# Patient Record
Sex: Female | Born: 1965 | Race: White | Hispanic: No | Smoking: Never smoker
Health system: Southern US, Community
[De-identification: ages and names within clinical notes are randomized; demographics above are authoritative.]

## PROBLEM LIST (undated history)

## (undated) DIAGNOSIS — Z9889 Other specified postprocedural states: Secondary | ICD-10-CM

## (undated) DIAGNOSIS — E119 Type 2 diabetes mellitus without complications: Secondary | ICD-10-CM

## (undated) DIAGNOSIS — E039 Hypothyroidism, unspecified: Secondary | ICD-10-CM

## (undated) DIAGNOSIS — R112 Nausea with vomiting, unspecified: Secondary | ICD-10-CM

## (undated) DIAGNOSIS — E7800 Pure hypercholesterolemia, unspecified: Secondary | ICD-10-CM

## (undated) DIAGNOSIS — F419 Anxiety disorder, unspecified: Secondary | ICD-10-CM

## (undated) DIAGNOSIS — M1990 Unspecified osteoarthritis, unspecified site: Secondary | ICD-10-CM

## (undated) DIAGNOSIS — E079 Disorder of thyroid, unspecified: Secondary | ICD-10-CM

## (undated) DIAGNOSIS — J45909 Unspecified asthma, uncomplicated: Secondary | ICD-10-CM

## (undated) DIAGNOSIS — I10 Essential (primary) hypertension: Secondary | ICD-10-CM

## (undated) HISTORY — PX: CARPAL TUNNEL RELEASE: SHX101

## (undated) HISTORY — PX: ROTATOR CUFF REPAIR: SHX139

## (undated) HISTORY — PX: GALLBLADDER SURGERY: SHX652

## (undated) HISTORY — PX: THUMB ARTHROSCOPY: SHX2509

## (undated) HISTORY — DX: Disorder of thyroid, unspecified: E07.9

## (undated) HISTORY — DX: Essential (primary) hypertension: I10

## (undated) HISTORY — PX: NECK SURGERY: SHX720

## (undated) HISTORY — PX: CHOLECYSTECTOMY: SHX55

## (undated) HISTORY — PX: KNEE ARTHROSCOPY: SUR90

## (undated) HISTORY — PX: WISDOM TOOTH EXTRACTION: SHX21

## (undated) HISTORY — PX: APPENDECTOMY: SHX54

## (undated) HISTORY — PX: SPINAL FUSION: SHX223

---

## 2003-02-24 ENCOUNTER — Encounter: Payer: Self-pay | Admitting: Family Medicine

## 2003-02-24 ENCOUNTER — Ambulatory Visit (HOSPITAL_COMMUNITY): Admission: RE | Admit: 2003-02-24 | Discharge: 2003-02-24 | Payer: Self-pay | Admitting: Family Medicine

## 2003-03-03 ENCOUNTER — Encounter: Payer: Self-pay | Admitting: Family Medicine

## 2003-03-03 ENCOUNTER — Encounter (HOSPITAL_COMMUNITY): Admission: RE | Admit: 2003-03-03 | Discharge: 2003-04-02 | Payer: Self-pay | Admitting: Family Medicine

## 2003-03-17 ENCOUNTER — Ambulatory Visit (HOSPITAL_COMMUNITY): Admission: RE | Admit: 2003-03-17 | Discharge: 2003-03-17 | Payer: Self-pay | Admitting: General Surgery

## 2004-06-13 ENCOUNTER — Ambulatory Visit (HOSPITAL_COMMUNITY): Admission: RE | Admit: 2004-06-13 | Discharge: 2004-06-13 | Payer: Self-pay | Admitting: Family Medicine

## 2004-06-17 ENCOUNTER — Ambulatory Visit (HOSPITAL_COMMUNITY): Admission: RE | Admit: 2004-06-17 | Discharge: 2004-06-17 | Payer: Self-pay | Admitting: Family Medicine

## 2004-12-29 HISTORY — PX: GALLBLADDER SURGERY: SHX652

## 2005-06-19 ENCOUNTER — Ambulatory Visit: Payer: Self-pay | Admitting: Orthopedic Surgery

## 2005-07-30 ENCOUNTER — Ambulatory Visit: Payer: Self-pay | Admitting: Internal Medicine

## 2005-08-05 ENCOUNTER — Ambulatory Visit (HOSPITAL_COMMUNITY): Admission: RE | Admit: 2005-08-05 | Discharge: 2005-08-05 | Payer: Self-pay | Admitting: Internal Medicine

## 2005-08-05 ENCOUNTER — Ambulatory Visit: Payer: Self-pay | Admitting: Internal Medicine

## 2005-08-05 ENCOUNTER — Encounter: Payer: Self-pay | Admitting: Internal Medicine

## 2006-12-29 HISTORY — PX: SPINAL FUSION: SHX223

## 2007-01-22 ENCOUNTER — Inpatient Hospital Stay (HOSPITAL_COMMUNITY): Admission: RE | Admit: 2007-01-22 | Discharge: 2007-01-25 | Payer: Self-pay | Admitting: Neurosurgery

## 2010-10-21 ENCOUNTER — Encounter: Admission: RE | Admit: 2010-10-21 | Discharge: 2010-10-21 | Payer: Self-pay | Admitting: Family Medicine

## 2011-05-16 NOTE — Op Note (Signed)
NAMELIESEL, PECKENPAUGH            ACCOUNT NO.:  [id number]   MEDICAL RECORD NO.:  [id number]          PATIENT TYPE:  INP   LOCATION:  2899                         FACILITY:  [facility]   PHYSICIAN:  Velora Heckler, MD      DATE OF BIRTH:  08-03-1966   DATE OF PROCEDURE:  01/22/2007  DATE OF DISCHARGE:                               OPERATIVE REPORT   PREOPERATIVE DIAGNOSIS:  Degenerative disk disease.   POSTOPERATIVE DIAGNOSIS:  Degenerative disk disease.   PROCEDURE:  Anterior preperitoneal exposure of lumbosacral spine.   SURGEON:  Velora Heckler, MD, FACS   ASSISTANT:  Harriette Bouillon MD, FACS   ANESTHESIA:  General.   ESTIMATED BLOOD LOSS:  Minimal.   PREPARATION:  Betadine.   COMPLICATIONS:  None.   INDICATIONS:  The patient is a 45 year old white female patient of Dr.  Coletta Memos from [place], [place].  She has degenerative disk  disease.  Dr. Franky Macho plans anterior fusion at the L5-S1 level.  He  desires an anterior preperitoneal exposure.   BODY OF REPORT:  Procedure is done in OR #4 at the neurosurgical  operating theater [place]. [facility].  The patient is  brought to the operating room, placed in supine position on the  operating room table.  Following administration of general anesthesia,  the patient is prepped and draped in usual strict aseptic fashion.  After ascertaining that an adequate level of anesthesia had been  obtained a left paramedian incision is made from just above the level of  the umbilicus to the pubis.  Dissection is carried through subcutaneous  tissues.  Hemostasis obtained with electrocautery.  The anterior rectus  fascia was incised with electrocautery.  The rectus muscle was mobilized  posteriorly and mobilized to the left.  Inferior epigastric vessels were  divided between hemostats and ligated with 2-0 silk ties.  Preperitoneal  space is entered laterally.  Peritoneal sac is then mobilized with  gentle blunt  dissection.  Round ligament of the uterus is divided  between hemostats and ligated with 2-0 silk ties.  Dissection was  continued down to the level of psoas musculature.  Peritoneal sac is  mobilized to the patient's right and cephalad.  A Synthes retractor set  is placed and used to maintain exposure.  Using gentle blunt dissection  the retroperitoneum is delineated.  The left ureter was mobilized across  the midline.  The L5-S1 disk space was identified.  Using the harmonic  scalpel.  The retroperitoneum is dissected.  The annulus of the disk is  exposed.  The confluence of the iliac veins and the bifurcation of aorta  are well cephalad of the disk space.  Synthes retractors placed to  maintain exposure.  The disk space completely cleared laterally and the  L5 vertebral body and S1 body are also exposed.  Good hemostasis was  obtained with the harmonic scalpel and the electrocautery.  At this  point Dr. Coletta Memos came to the operating room.  He will dictate the  instrumentation of the spine under a separate operative report.   After instrumentation of the  spine is complete and radiographs were  taken, general surgery returned to the room to inspect the wound and  close the abdomen.  Good hemostasis was noted throughout.  Retractor  system was removed.  All packs were removed.  Good hemostasis was noted.  Peritoneal sac is returned to its normal anatomic location.  Rectus  muscle appears viable.  Anterior rectus sheath is closed with  interrupted #1 Vicryl sutures.  Subcutaneous tissues irrigated.  Skin  was closed with stainless steel staples.  Sterile dressings were  applied.  The patient is awakened from anesthesia and brought to the  recovery room in stable condition.  The patient tolerated the procedure  well.      Velora Heckler, MD  Electronically Signed     TMG/MEDQ  D:  01/22/2007  T:  01/22/2007  Job:  [id number]   cc:   Coletta Memos, M.D.

## 2011-05-16 NOTE — Op Note (Signed)
[name], [name]                      ACCOUNT NO.:  [id number]   MEDICAL RECORD NO.:  [id number]                   PATIENT TYPE:  AMB   LOCATION:  DAY                                  FACILITY:  [facility]   PHYSICIAN:  Dalia Heading, M.D.               DATE OF BIRTH:  1966-06-23   DATE OF PROCEDURE:  03/17/2003  DATE OF DISCHARGE:                                 OPERATIVE REPORT   PREOPERATIVE DIAGNOSIS:  Chronic cholecystitis.   POSTOPERATIVE DIAGNOSIS:  Chronic cholecystitis.   PROCEDURE:  Laparoscopic cholecystectomy.   SURGEON:  Dalia Heading, M.D.   ASSISTANT:  Bernerd Limbo. Leona Carry, M.D.   ANESTHESIA:  General endotracheal.   INDICATIONS FOR PROCEDURE:  The patient is a 45 year old white female who  was referred for evaluation and treatment of biliary colic secondary to  chronic cholecystitis.  The risks and benefits of the procedure, including  bleeding, infection, hepatobiliary injury, and the possibility of an open  procedure were fully explained to the patient who gave informed consent.   PROCEDURE:  The patient was placed in the supine position.  After induction  of general endotracheal anesthesia, the abdomen was prepped and draped using  the usual sterile technique with Betadine.  Surgical site confirmation was  performed.   A supraumbilical incision was made down to the fascia.  The Veress needle  was introduced into the abdominal cavity, and confirmation of placement was  done using the saline drop test.  The abdomen was then insufflated to 16  mmHg pressure.  An 11-mm trocar was introduced into the abdominal cavity  under direct visualization without difficulty.  The patient was placed in  reverse Trendelenburg position, and an additional 11-mm trocar was placed in  the epigastric region and 5-mm trocars were placed in the right upper  quadrant and right flank regions.  The liver was inspected and noted to be  within normal limits.  The gallbladder was  retracted superiorly and  laterally.  The dissection was begun around the infundibulum of the  gallbladder.  The cystic duct was first identified.  Its junction to the  infundibulum was fully identified.  Endoclips were placed proximally and  distally on the cystic duct, and the cystic duct was divided.  This was  likewise done on the cystic artery.  The gallbladder was then freed away  from the gallbladder fossa using Bovie electrocautery.  The gallbladder was  delivered through the epigastric trocar site without difficulty.  The  gallbladder fossa was inspected, and no abnormal bleeding or bile leakage  was noted.  Surgicel was placed in the gallbladder fossa.  The subhepatic  space as well as right hepatic gutter were evacuated of all fluid and air  prior to removal of the trocars.   All wounds were irrigated with normal saline.  All wounds were injected with  0.5% Sensorcaine.  The supraumbilical fascia was reapproximated using an 0  Vicryl interrupted suture.  All skin incisions were closed using staples.  Betadine ointment and dry sterile dressings were applied.   All tape and needle counts were correct at the end of the procedure.  The  patient was awakened, extubated, and transferred to the PACU in stable  condition.   COMPLICATIONS:  None.   SPECIMENS:  Gallbladder.   ESTIMATED BLOOD LOSS:  Minimal.                                               Dalia Heading, M.D.    MAJ/MEDQ  D:  03/17/2003  T:  03/17/2003  Job:  [id number]   cc:   Kirk Ruths, M.D.  [address]  [place]  [place] [address]  Fax: [phone]

## 2011-05-16 NOTE — Op Note (Signed)
[name], [name]            ACCOUNT NO.:  [id number]   MEDICAL RECORD NO.:  [id number]          PATIENT TYPE:  INP   LOCATION:  2899                         FACILITY:  [facility]   PHYSICIAN:  Coletta Memos, M.D.     DATE OF BIRTH:  1966-12-27   DATE OF PROCEDURE:  01/22/2007  DATE OF DISCHARGE:                               OPERATIVE REPORT   PREOPERATIVE DIAGNOSES:  1. Degenerative disk disease, L5-S1.  2. Low back pain.  3. Spondylosis without myelopathy, lumbar spine.   POSTOPERATIVE DIAGNOSES:  1. Degenerative disk disease, L5-S1.  2. Low back pain.  3. Spondylosis without myelopathy, lumbar spine.   PROCEDURE:  Anterior lumbar interbody fusion using Synthes PEEK  interbody cage filled with morselized allograft graft.   COMPLICATIONS:  None.   SURGEON:  [place].   ASSISTANT:  Phoebe Perch.   Opening and approach to be dictated under separate cover by Dr. Darnell Level.   INDICATIONS:  [name] is a woman who presents with significant low  back pain.  MRI showed a degenerated disk without disk herniations or  nerve root compression.   OPERATIVE NOTE:  [name] was brought to the operating room intubated  and placed under general anesthetic without difficulty.  She was  positioned supine on the table, and all pressure points were properly  padded.  A Foley catheter was then placed under sterile conditions.  Dr.  Gerrit Friends then prepped and opened the abdomen.  Again, that will be  dictated under separate cover.  After opening and exposing the L5-S1  disk space, I then took x-rays to confirm my location.  Once that was  done, I then proceeded to perform a diskectomy at L5-S1 for  decompression of the spinal canal.  I prepared the endplates by  scraping down the bone and roughing the surfaces with curettes.  With  Dr. Doreen Beam assistance, we removed the disk material until I felt that  I had adequate exposure and good interface available.  I then sized the  disk space  with a trial.  I then placed a 8-mm angle small foot print  Synthes Syn Fix device.  That was placed using a ___________ retractor  without difficulty.  I then placed four  screws first by using an awl and then using self-drilling and tapping  screws.  This was done with Dr. Doreen Beam assistance.  Final x-ray showed  that the SynFix and screws were in good position.  I placed some Vitoss  around the bone.  Infuse was placed within the device.  The patient  tolerated the procedure. After that, the patient had a close.           ______________________________  Coletta Memos, M.D.     KC/MEDQ  D:  01/22/2007  T:  01/22/2007  Job:  [id number]

## 2011-05-16 NOTE — H&P (Signed)
   [name], [name]                      ACCOUNT NO.:  [id number]   MEDICAL RECORD NO.:  [id number]                   PATIENT TYPE:   LOCATION:                                       FACILITY:   PHYSICIAN:  Dalia Heading, M.D.               DATE OF BIRTH:  Jan 26, 1966   DATE OF ADMISSION:  03/17/2003  DATE OF DISCHARGE:                                HISTORY & PHYSICAL   CHIEF COMPLAINT:  Chronic cholecystectomy.   HISTORY OF PRESENT ILLNESS:  The patient is a 45 year old white female who  was referred for evaluation and treatment of biliary colic secondary to  chronic cholecystitis.  She has been having right upper quadrant abdominal  pain with radiation to the right flank and back, nausea, indigestion,  bloating, and fatty food intolerance over the past few months.  No fever,  chills, or jaundice had been noted.   PAST MEDICAL HISTORY:  Extrinsic allergies.   PAST SURGICAL HISTORY:  Appendectomy, knee surgery, ACL repair.   CURRENT MEDICATIONS:  Allegra p.r.n., Bextra p.r.n.   ALLERGIES:  No known drug allergies.   REVIEW OF SYSTEMS:  The patient denies smoking.  She denies any other  cardiopulmonary difficulties or bleeding disorders.   PHYSICAL EXAMINATION:  GENERAL:  The patient is a well-developed, well-  nourished white female in no acute distress.  VITAL SIGNS:  She is afebrile and vital signs are stable.  HEENT:  Reveals no scleral icterus.  LUNGS:  Clear to auscultation with equal breath sounds bilaterally.  HEART:  Reveals a regular rate and rhythm without S3, S4, or murmurs.  ABDOMEN:  Soft and nondistended.  She is tender in the right upper quadrant  to palpation.  No hepatosplenomegaly, masses, or hernias are identified.   LABORATORY DATA:  Ultrasound of gallbladder was reportedly normal.  Hepatobiliary scan reveals a 30% gallbladder ejection fraction with  reproducible symptoms with CCK injection.   IMPRESSION:  Chronic cholecystitis.    PLAN:   The patient is scheduled for a laparoscopic cholecystectomy on March 17, 2003.  The risks and benefits of the procedure including bleeding,  infection, hepatobiliary injury, the possibility of an open procedure were  fully explained to the patient, who gave informed consent.                                               Dalia Heading, M.D.    MAJ/MEDQ  D:  03/07/2003  T:  03/07/2003  Job:  [id number]   cc:   Kirk Ruths, M.D.  [address]  [place]  [place] [address]  Fax: [phone]

## 2011-05-16 NOTE — Consult Note (Signed)
[name], [name]            ACCOUNT NO.:  [id number]   MEDICAL RECORD NO.:  [id number]         PATIENT TYPE:  AMB   LOCATION:                                FACILITY:  [facility]   PHYSICIAN:  R. Roetta Sessions, M.D. DATE OF BIRTH:  1966/01/01   DATE OF CONSULTATION:  07/30/2005  DATE OF DISCHARGE:                                   CONSULTATION   GASTROENTEROLOGY CONSULTATION:   PHYSICIAN REQUESTING CONSULTATION:  Bill McGough   CHIEF COMPLAINT:  Diarrhea.   HISTORY OF PRESENT ILLNESS:  [name] is a 45 year old Caucasian female patient  of Dr. Regino Schultze who presents today for further evaluation of chronic  diarrhea.  Two months ago she developed acute onset of diarrhea.  She was  having multiple loose to watery stools daily, especially postprandially.  She saw Dr. Regino Schultze and was started on Flagyl and Lomotil.  She had negative  stool culture, Clostridium difficile, O&P.  She states that the Flagyl did  not seem to help her diarrhea.  As long as she stays on the Lomotil her  stools seem to slow down.  She denies any nocturnal diarrhea.  She has not  had any solid stools in a couple of months.  Prior to this her baseline  bowel movement pattern was generally one daily.  She complains of some  sharp, stabbing, fleeting pains in her abdomen, especially in the morning.  This is often relieved after she has a bowel movement.  Denies any melena or  rectal bleeding.  In the beginning, she did have some nausea but no  vomiting.  She complains of fatigue and feeling run down since this started.  Denies any weight loss or heartburn.  She had her gallbladder removed about  1-1/2 years ago.  Denies any diarrhea associated with this.  She has not  been traveling.  No antibiotic use prior to the development of these  symptoms.  Denies any ill contacts.   CURRENT MEDICATIONS:  1. Allegra-D one b.i.d.  2. Lomotil p.r.n.  3. Tylenol ES p.r.n.  4. Multivitamin with iron once daily.   ALLERGIES:   No known drug allergies.   PAST MEDICAL HISTORY:  Negative for chronic illnesses.   PAST SURGICAL HISTORY:  She has had an appendectomy and cholecystectomy.  She had two knee surgeries, the last one for ACL repair.   FAMILY HISTORY:  Negative for colorectal cancer.  She believes she has a  cousin with Crohn's disease.  Her father has history of brain tumor.   SOCIAL HISTORY:  She is single.  She works for [employer].  She has  never been a smoker, occasionally consumes alcohol.   REVIEW OF SYSTEMS:  GI:  See HPI.  CONSTITUTIONAL:  Denies weight loss.  CARDIOPULMONARY:  Denies chest pain or shortness of breath.   PHYSICAL EXAMINATION:  VITAL SIGNS:  Weight 148, height 5 feet 6 inches,  temperature 98.1, blood pressure 136/76, pulse 64.  GENERAL:  Pleasant well-nourished, well-developed Caucasian female in no  acute distress.  Skin warm and dry; no jaundice.  HEENT:  Pupils equal, round and reactive to light.  Conjunctivae are pink.  Sclerae nonicteric.  Oropharyngeal mucosa moist and pink; no lesions,  erythema, or exudate.  No lymphadenopathy or thyromegaly.  CHEST:  Lungs are clear to auscultation.  Cardiac exam reveals regular rate  and rhythm, normal S1, S2; no murmurs, rubs, or gallops.  ABDOMEN:  Positive bowel sounds, soft, nondistended.  She has mild to  moderate tenderness in the epigastrium and in the right lower quadrant to  deep palpation; no organomegaly or masses, no rebound tenderness, no  guarding, no abdominal bruits or hernias.  EXTREMITIES:  No edema.   IMPRESSION:  [name] is a 45 year old lady with 62-month history of nonbloody  diarrhea associated with epigastric and right lower quadrant abdominal pain.  Stool studies have been negative.  She did not respond to a trial of Flagyl.  Differential diagnosis is quite broad but high on the list would be  inflammatory bowel disease.   PLAN:  1. Colonoscopy with terminal ileoscopy plus or minus biopsies and/or  stool      collection in the near future.  2. Her colon prep would consist of OsmoPrep.  3. CBC, TSH, MET-7, LFTs.   I would like to thank Dr. Regino Schultze for allowing [place] to take part in the care  of this patient.       LL/MEDQ  D:  07/30/2005  T:  07/30/2005  Job:  [id number]   cc:   Kirk Ruths, M.D.  [address]  [place]  [place] [address]  Fax: [phone]

## 2011-05-16 NOTE — Op Note (Signed)
[name], [name]            ACCOUNT NO.:  [id number]   MEDICAL RECORD NO.:  [id number]          PATIENT TYPE:  AMB   LOCATION:  DAY                           FACILITY:  [facility]   PHYSICIAN:  R. Roetta Sessions, M.D. DATE OF BIRTH:  1966/06/22   DATE OF PROCEDURE:  08/05/2005  DATE OF DISCHARGE:                                 OPERATIVE REPORT   PROCEDURE:  Colonoscopy with ileostomy, sentinel biopsy, stool sampling.   INDICATIONS FOR PROCEDURE:  The patient is a 45 year old lady with a two-  month history of nonbloody diarrhea.  Stool studies have come back negative.  Colonoscopy is now being done.  This procedure has been discussed with the  patient at length.  Potential risks, benefits and alternatives have been  reviewed.  Questions answered.  She is agreeable.  Please see documentation  in the medical record.   PROCEDURE NOTE:  O2 saturation, blood pressure, pulse and respirations were  monitored through the entire procedure.  Conscious sedation with Versed 4 mg  IV, Demerol 75 mg IV divided doses.   INSTRUMENT:  Olympus video chip system.   FINDINGS:  A digital rectal exam revealed no abnormalities.   ENDOSCOPIC FINDINGS:  The prep was good.   Rectum:  Examination of the rectal mucosa including retroflexion at the anal  verge revealed no abnormalities.   Colon:  Colonic mucosa was surveyed from the rectal sigmoid junction through  the left, transverse, right colon to the area of the appendiceal orifice,  ileocecal valve and cecum.  These structures were well seen and photographed  for the record.  From this level, scope was slowly withdrawn and all  previously mentioned mucosal surfaces were again seen.  The terminal ileum  was intubated to 15 cm as well.  The terminal ileum mucosa appeared normal.  The colonic mucosa appeared normal.  Segmental biopsies of the transverse,  sigmoid and rectal mucosa were taken for histologic study.  All stool  residue was suctioned out  for microbiology studies.  The patient tolerated  the procedure well and was reactive to endoscopy.   IMPRESSION:  Normal rectum, colon, terminal ileum, segmental biopsies taken,  stool sample taken.   RECOMMENDATIONS:  Follow up on pending studies.  Further recommendations to  follow.       RMR/MEDQ  D:  08/05/2005  T:  08/05/2005  Job:  [id number]   cc:   Kirk Ruths, M.D.  [address]  [place]  [place] [address]  Fax: [phone]

## 2011-05-16 NOTE — Discharge Summary (Signed)
[name], [name]            ACCOUNT NO.:  [id number]   MEDICAL RECORD NO.:  [id number]          PATIENT TYPE:  INP   LOCATION:  3011                         FACILITY:  [facility]   PHYSICIAN:  Coletta Memos, M.D.     DATE OF BIRTH:  Nov 24, 1966   DATE OF ADMISSION:  01/22/2007  DATE OF DISCHARGE:  01/25/2007                               DISCHARGE SUMMARY   ADMITTING DIAGNOSES:  1. Degenerative disk disease L5-S1.  2. Low back pain.  3. Spondylosis without myelopathy.   DISCHARGE DIAGNOSES:  1. Degenerative disk disease L5-S1.  2. Low back pain.  3. Spondylosis without myelopathy.   PROCEDURE:  Anterior lumbar interbody fusion using Synthes PEEK  interbody cage with morselized allograft.   COMPLICATIONS:  None.   This was a 10-6 device, 8 mm angle, small footplate.   [name] was admitted to the hospital secondary to the significant  low back pain and degenerative disk disease at L5-S1.  I recommended an  anterior approach for lumbar fusion.  She agreed.  She was immediately  taken to the operating room where she had an uncomplicated procedure.  The 10-6 was placed without difficulty.  Postoperatively, she has  tolerating a regular diet.  She is voiding without difficulty.  She has  a mild pain in the left hip when she flexes the left leg.  Outside of  that, she is doing well.   I gave her Percocet and Flexeril today for discharge.  I will see her  back in 1 month.  The wound has already been checked by general surgery.           ______________________________  Coletta Memos, M.D.     KC/MEDQ  D:  01/25/2007  T:  01/25/2007  Job:  [id number]

## 2011-09-16 ENCOUNTER — Other Ambulatory Visit: Payer: Self-pay | Admitting: Family Medicine

## 2011-09-16 DIAGNOSIS — Z1231 Encounter for screening mammogram for malignant neoplasm of breast: Secondary | ICD-10-CM

## 2011-10-24 ENCOUNTER — Ambulatory Visit
Admission: RE | Admit: 2011-10-24 | Discharge: 2011-10-24 | Disposition: A | Discharge status: Home / Self Care | Payer: BC Managed Care – PPO | Source: Ambulatory Visit | Attending: Family Medicine | Admitting: Family Medicine

## 2011-10-24 DIAGNOSIS — Z1231 Encounter for screening mammogram for malignant neoplasm of breast: Secondary | ICD-10-CM

## 2013-08-06 ENCOUNTER — Other Ambulatory Visit (HOSPITAL_COMMUNITY): Payer: Self-pay | Admitting: Family Medicine

## 2013-08-06 ENCOUNTER — Ambulatory Visit (HOSPITAL_COMMUNITY)
Admission: RE | Admit: 2013-08-06 | Discharge: 2013-08-06 | Disposition: A | Discharge status: Home / Self Care | Payer: Managed Care, Other (non HMO) | Source: Ambulatory Visit | Attending: Family Medicine | Admitting: Family Medicine

## 2013-08-06 DIAGNOSIS — M25572 Pain in left ankle and joints of left foot: Secondary | ICD-10-CM

## 2013-08-06 DIAGNOSIS — M7730 Calcaneal spur, unspecified foot: Secondary | ICD-10-CM | POA: Insufficient documentation

## 2013-10-04 ENCOUNTER — Other Ambulatory Visit: Payer: Self-pay

## 2013-10-04 DIAGNOSIS — Z1231 Encounter for screening mammogram for malignant neoplasm of breast: Secondary | ICD-10-CM

## 2013-11-11 ENCOUNTER — Ambulatory Visit: Payer: BC Managed Care – PPO

## 2013-12-16 ENCOUNTER — Ambulatory Visit
Admission: RE | Admit: 2013-12-16 | Discharge: 2013-12-16 | Disposition: A | Discharge status: Home / Self Care | Payer: Managed Care, Other (non HMO) | Source: Ambulatory Visit

## 2013-12-16 DIAGNOSIS — Z1231 Encounter for screening mammogram for malignant neoplasm of breast: Secondary | ICD-10-CM

## 2013-12-28 ENCOUNTER — Other Ambulatory Visit: Payer: Self-pay | Admitting: Family Medicine

## 2013-12-28 DIAGNOSIS — R928 Other abnormal and inconclusive findings on diagnostic imaging of breast: Secondary | ICD-10-CM

## 2013-12-29 HISTORY — PX: CARPAL TUNNEL RELEASE: SHX101

## 2014-01-06 ENCOUNTER — Ambulatory Visit
Admission: RE | Admit: 2014-01-06 | Discharge: 2014-01-06 | Disposition: A | Discharge status: Home / Self Care | Payer: Managed Care, Other (non HMO) | Source: Ambulatory Visit | Attending: Family Medicine | Admitting: Family Medicine

## 2014-01-06 DIAGNOSIS — R928 Other abnormal and inconclusive findings on diagnostic imaging of breast: Secondary | ICD-10-CM

## 2015-03-22 ENCOUNTER — Other Ambulatory Visit (HOSPITAL_COMMUNITY): Payer: Self-pay | Admitting: Family Medicine

## 2015-03-22 DIAGNOSIS — M5030 Other cervical disc degeneration, unspecified cervical region: Secondary | ICD-10-CM

## 2015-03-30 ENCOUNTER — Ambulatory Visit (HOSPITAL_COMMUNITY)
Admission: RE | Admit: 2015-03-30 | Discharge: 2015-03-30 | Disposition: A | Discharge status: Home / Self Care | Payer: Managed Care, Other (non HMO) | Source: Ambulatory Visit | Attending: Family Medicine | Admitting: Family Medicine

## 2015-03-30 DIAGNOSIS — M5030 Other cervical disc degeneration, unspecified cervical region: Secondary | ICD-10-CM

## 2015-07-04 ENCOUNTER — Other Ambulatory Visit: Payer: Self-pay

## 2015-07-04 DIAGNOSIS — Z1231 Encounter for screening mammogram for malignant neoplasm of breast: Secondary | ICD-10-CM

## 2015-07-11 ENCOUNTER — Ambulatory Visit
Admission: RE | Admit: 2015-07-11 | Discharge: 2015-07-11 | Disposition: A | Discharge status: Home / Self Care | Payer: Managed Care, Other (non HMO) | Source: Ambulatory Visit

## 2015-07-11 DIAGNOSIS — Z1231 Encounter for screening mammogram for malignant neoplasm of breast: Secondary | ICD-10-CM

## 2016-06-12 ENCOUNTER — Ambulatory Visit (INDEPENDENT_AMBULATORY_CARE_PROVIDER_SITE_OTHER): Payer: Self-pay | Admitting: Otolaryngology

## 2016-06-12 DIAGNOSIS — R42 Dizziness and giddiness: Secondary | ICD-10-CM

## 2016-06-12 DIAGNOSIS — H9313 Tinnitus, bilateral: Secondary | ICD-10-CM

## 2016-06-16 ENCOUNTER — Other Ambulatory Visit (INDEPENDENT_AMBULATORY_CARE_PROVIDER_SITE_OTHER): Payer: Self-pay | Admitting: Otolaryngology

## 2016-06-16 DIAGNOSIS — H9313 Tinnitus, bilateral: Secondary | ICD-10-CM

## 2016-06-16 DIAGNOSIS — R42 Dizziness and giddiness: Secondary | ICD-10-CM

## 2016-07-02 ENCOUNTER — Ambulatory Visit
Admission: RE | Admit: 2016-07-02 | Discharge: 2016-07-02 | Disposition: A | Discharge status: Home / Self Care | Payer: 59 | Source: Ambulatory Visit | Attending: Otolaryngology | Admitting: Otolaryngology

## 2016-07-02 DIAGNOSIS — R42 Dizziness and giddiness: Secondary | ICD-10-CM

## 2016-07-02 DIAGNOSIS — H9313 Tinnitus, bilateral: Secondary | ICD-10-CM

## 2016-12-29 HISTORY — PX: NECK SURGERY: SHX720

## 2017-01-09 DIAGNOSIS — E039 Hypothyroidism, unspecified: Secondary | ICD-10-CM | POA: Diagnosis not present

## 2017-01-09 DIAGNOSIS — J209 Acute bronchitis, unspecified: Secondary | ICD-10-CM | POA: Diagnosis not present

## 2017-01-09 DIAGNOSIS — J069 Acute upper respiratory infection, unspecified: Secondary | ICD-10-CM | POA: Diagnosis not present

## 2017-01-09 DIAGNOSIS — J343 Hypertrophy of nasal turbinates: Secondary | ICD-10-CM | POA: Diagnosis not present

## 2017-03-25 DIAGNOSIS — E039 Hypothyroidism, unspecified: Secondary | ICD-10-CM | POA: Diagnosis not present

## 2017-03-25 DIAGNOSIS — E782 Mixed hyperlipidemia: Secondary | ICD-10-CM | POA: Diagnosis not present

## 2017-03-25 DIAGNOSIS — R7309 Other abnormal glucose: Secondary | ICD-10-CM | POA: Diagnosis not present

## 2017-03-25 DIAGNOSIS — Z1389 Encounter for screening for other disorder: Secondary | ICD-10-CM | POA: Diagnosis not present

## 2017-03-25 DIAGNOSIS — Z0001 Encounter for general adult medical examination with abnormal findings: Secondary | ICD-10-CM | POA: Diagnosis not present

## 2017-03-25 DIAGNOSIS — I10 Essential (primary) hypertension: Secondary | ICD-10-CM | POA: Diagnosis not present

## 2017-07-03 DIAGNOSIS — M5136 Other intervertebral disc degeneration, lumbar region: Secondary | ICD-10-CM | POA: Diagnosis not present

## 2017-07-03 DIAGNOSIS — M5431 Sciatica, right side: Secondary | ICD-10-CM | POA: Diagnosis not present

## 2017-07-03 DIAGNOSIS — M545 Low back pain: Secondary | ICD-10-CM | POA: Diagnosis not present

## 2017-07-10 DIAGNOSIS — M4807 Spinal stenosis, lumbosacral region: Secondary | ICD-10-CM | POA: Diagnosis not present

## 2017-07-10 DIAGNOSIS — M5431 Sciatica, right side: Secondary | ICD-10-CM | POA: Diagnosis not present

## 2017-07-10 DIAGNOSIS — M48061 Spinal stenosis, lumbar region without neurogenic claudication: Secondary | ICD-10-CM | POA: Diagnosis not present

## 2017-07-10 DIAGNOSIS — M5136 Other intervertebral disc degeneration, lumbar region: Secondary | ICD-10-CM | POA: Diagnosis not present

## 2017-08-25 DIAGNOSIS — M25551 Pain in right hip: Secondary | ICD-10-CM | POA: Diagnosis not present

## 2017-08-25 DIAGNOSIS — I10 Essential (primary) hypertension: Secondary | ICD-10-CM | POA: Diagnosis not present

## 2017-09-09 ENCOUNTER — Ambulatory Visit (HOSPITAL_COMMUNITY)
Admission: RE | Admit: 2017-09-09 | Discharge: 2017-09-09 | Disposition: A | Discharge status: Home / Self Care | Payer: 59 | Source: Ambulatory Visit | Attending: Family Medicine | Admitting: Family Medicine

## 2017-09-09 ENCOUNTER — Other Ambulatory Visit (HOSPITAL_COMMUNITY): Payer: Self-pay | Admitting: Family Medicine

## 2017-09-09 DIAGNOSIS — K5730 Diverticulosis of large intestine without perforation or abscess without bleeding: Secondary | ICD-10-CM | POA: Insufficient documentation

## 2017-09-09 DIAGNOSIS — R1031 Right lower quadrant pain: Secondary | ICD-10-CM | POA: Diagnosis not present

## 2017-09-09 DIAGNOSIS — K760 Fatty (change of) liver, not elsewhere classified: Secondary | ICD-10-CM | POA: Insufficient documentation

## 2017-09-09 MED ORDER — IOPAMIDOL (ISOVUE-300) INJECTION 61%
100.0000 mL | Freq: Once | INTRAVENOUS | Status: AC | PRN
  Administered 2017-09-09: 100 mL via INTRAVENOUS

## 2017-09-09 MED ORDER — IOPAMIDOL (ISOVUE-300) INJECTION 61%
INTRAVENOUS | Status: AC
  Filled 2017-09-09: qty 30

## 2017-09-29 ENCOUNTER — Ambulatory Visit (INDEPENDENT_AMBULATORY_CARE_PROVIDER_SITE_OTHER): Payer: 59 | Admitting: Obstetrics & Gynecology

## 2017-09-29 ENCOUNTER — Encounter: Payer: Self-pay | Admitting: Obstetrics & Gynecology

## 2017-09-29 VITALS — BP 124/68 | Ht 66.0 in | Wt 185.0 lb

## 2017-09-29 DIAGNOSIS — M5441 Lumbago with sciatica, right side: Secondary | ICD-10-CM

## 2017-09-29 DIAGNOSIS — R9389 Abnormal findings on diagnostic imaging of other specified body structures: Secondary | ICD-10-CM | POA: Diagnosis not present

## 2017-09-29 MED ORDER — MEDROXYPROGESTERONE ACETATE 10 MG PO TABS: 10.0000 mg | ORAL_TABLET | Freq: Every day | ORAL | 0 refills | Status: DC

## 2017-09-29 NOTE — Progress Notes (Signed)
Chief Complaint  Patient presents with  . Hip Pain  . Back Pain    Blood pressure 124/68, height  (1.676 m), weight 185 lb (83.9 kg).  51 y.o. G0P0000 No LMP recorded (lmp unknown). Patient is postmenopausal. The current method of family planning is post menopausal status.  Outpatient Encounter Prescriptions as of 09/29/2017  Medication Sig  . atenolol (TENORMIN) 25 MG tablet Take 25 mg by mouth daily.  Marland Kitchen BLACK COHOSH EXTRACT PO Take by mouth.  . fexofenadine-pseudoephedrine (ALLEGRA-D 24) 180-240 MG 24 hr tablet Take 1 tablet by mouth daily.  Marland Kitchen levothyroxine (SYNTHROID, LEVOTHROID) 25 MCG tablet Take 25 mcg by mouth daily before breakfast.  . TURMERIC PO Take by mouth.  . medroxyPROGESTERone (PROVERA) 10 MG tablet Take 1 tablet (10 mg total) by mouth daily.   No facility-administered encounter medications on file as of 09/29/2017.     Subjective [name] is seen as a new patient She is referred here from Dr. Lamar Blinks office [name] has an interesting history She underwent a laminectomy with fixation cages about 10 years ago she says L5-S1 and is also had an anterior fixation of ruptured cervical disc  About safe 3-5 months ago she began having sciatica on the right, pain in her tailbone and pain in her right hip almost above her iliac crest  Dr. Phillips Odor ordered a CT scan which is essentially unremarkable except for a minimally thickened endometrial stripe of 11 mm I reviewed the CT scan myself and she's got small insignificant fibroids less than 1 cm and I really can't make out the endometrial stripe definitively but in any event she denies any bleeding over the past 2 years  I believe she was told that there was concern that she may have endometriosis and that may be the cause of her pain but that is certainly not the case both on the official read N/A my evaluation of the CT  Unfortunately I think she stuck with a back source as her pain source She was  told by her neurosurgeon that the pain she is having is not related to either her previous back surgery or any current pathology  Objective Not performed  Pertinent ROS No vaginal bleeding over 2 years No burning with urination, frequency or urgency No nausea, vomiting or diarrhea Nor fever chills or other constitutional symptoms   Labs or studies I reviewed her CT scan including the images The patient had MRI up and not eaten but that is not available to me and she stated that there is a bulging disc between L4-L5 but is bulging to the left not the right and it is not ruptured CLINICAL DATA:  Right lower quadrant pain for 2-3 months.  EXAM: CT ABDOMEN AND PELVIS WITH CONTRAST  TECHNIQUE: Multidetector CT imaging of the abdomen and pelvis was performed using the standard protocol following bolus administration of intravenous contrast.  CONTRAST:  ISOVUE-300 IOPAMIDOL (ISOVUE-300) INJECTION 61%  COMPARISON:  None.  FINDINGS: Lower chest:  No contributory findings.  Hepatobiliary: Hepatic steatosis. No focal finding.Cholecystectomy with normal common bile duct diameter.  Pancreas: Unremarkable.  Spleen: Unremarkable.  Adrenals/Urinary Tract: Negative adrenals. No hydronephrosis or stone. 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 Unremarkable bladder.  Stomach/Bowel: No obstruction. The appendix is difficult to discretely visualize. No pericecal inflammation. No evidence of bowel inflammation of any kind. Mild colonic diverticulosis.  Vascular/Lymphatic: No acute vascular abnormality. No mass or adenopathy.  Reproductive:Notable endometrial thickness of  11 mm for age. Negative adnexae. Heterogeneous myometrial enhancement versus small intramural fibroids.  Other: No ascites or pneumoperitoneum.  Musculoskeletal: No acute or aggressive finding. 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  IMPRESSION: 1. No acute  finding or specific explanation for pain. 2. Hepatic steatosis. 3. Mild colonic diverticulosis. 4. 11 mm endometrial stripe. Given patient age, please ensure no abnormal uterine bleeding or completed menopause, which would prompt ultrasound.   Electronically Signed   By: Marnee Spring M.D.   On: 09/09/2017 14:35         Impression Diagnoses this Encounter::   ICD-10-CM   1. Thickened endometrium R93.89   2. Acute right-sided low back pain with right-sided sciatica M54.41     Established relevant diagnosis(es):   Plan/Recommendations: Meds ordered this encounter  Medications  . atenolol (TENORMIN) 25 MG tablet    Sig: Take 25 mg by mouth daily.  . fexofenadine-pseudoephedrine (ALLEGRA-D 24) 180-240 MG 24 hr tablet    Sig: Take 1 tablet by mouth daily.  Marland Kitchen levothyroxine (SYNTHROID, LEVOTHROID) 25 MCG tablet    Sig: Take 25 mcg by mouth daily before breakfast.  . BLACK COHOSH EXTRACT PO    Sig: Take by mouth.  . TURMERIC PO    Sig: Take by mouth.  . medroxyPROGESTERone (PROVERA) 10 MG tablet    Sig: Take 1 tablet (10 mg total) by mouth daily.    Dispense:  10 tablet    Refill:  0    Labs or Scans Ordered: No orders of the defined types were placed in this encounter.   Management:: Again [name]'s GYN finding of a minimally thickened endometrial stripe of think is unrelated to her back hip or sciatica pain Plan give her 10 days of Provera 10 mg and will see she has any bleeding she is given a contact me via my chart and let me know if she bleeds or doesn't If she does not bleed I will probably do an ultrasound to evaluate her endometrium And if she does bleed probably do cyclical Provera every 3 months for the next couple years until she stops bleeding for year  I have also referred her to Dr. Eduard Clos have in [place] for evaluation of her back pain Dr Eduard Clos is a physiatrists and chronic pain specialist who does a lot of blocks and injections [name]  seems interested in having her evaluate her and treat her for her apparently nonsurgical back pain  Follow up Return if symptoms worsen or fail to improve.        Face to face time:  20 minutes  Greater than 50% of the visit time was spent in counseling and coordination of care with the patient.  The summary and outline of the counseling and care coordination is summarized in the note above.   All questions were answered.  Past Medical History:  Diagnosis Date  . Hypertension   . Thyroid disease     Past Surgical History:  Procedure Laterality Date  . APPENDECTOMY    . CARPAL TUNNEL RELEASE Right   . GALLBLADDER SURGERY    . KNEE ARTHROSCOPY Right    x3  . NECK SURGERY    . SPINAL FUSION      OB History    Gravida Para Term Preterm AB Living   0 0 0 0 0 0   SAB TAB Ectopic Multiple Live Births   0 0 0 0 0      Allergies  Allergen Reactions  .  Latex Anaphylaxis    Social History   Social History  . Marital status: Married    Spouse name: N/A  . Number of children: N/A  . Years of education: N/A   Social History Main Topics  . Smoking status: Never Smoker  . Smokeless tobacco: Never Used  . Alcohol use No  . Drug use: No  . Sexual activity: Yes    Birth control/ protection: Post-menopausal   Other Topics Concern  . None   Social History Narrative  . None    Family History  Problem Relation Age of Onset  . Myasthenia gravis Father   . Hypertension Mother

## 2018-03-26 DIAGNOSIS — M13841 Other specified arthritis, right hand: Secondary | ICD-10-CM | POA: Diagnosis not present

## 2018-06-11 DIAGNOSIS — J069 Acute upper respiratory infection, unspecified: Secondary | ICD-10-CM | POA: Diagnosis not present

## 2018-06-11 DIAGNOSIS — I10 Essential (primary) hypertension: Secondary | ICD-10-CM | POA: Diagnosis not present

## 2018-06-11 DIAGNOSIS — R7309 Other abnormal glucose: Secondary | ICD-10-CM | POA: Diagnosis not present

## 2018-06-11 DIAGNOSIS — E782 Mixed hyperlipidemia: Secondary | ICD-10-CM | POA: Diagnosis not present

## 2018-06-11 DIAGNOSIS — Z1389 Encounter for screening for other disorder: Secondary | ICD-10-CM | POA: Diagnosis not present

## 2018-06-23 ENCOUNTER — Encounter: Payer: Self-pay | Admitting: Internal Medicine

## 2018-07-27 ENCOUNTER — Ambulatory Visit: Payer: 59

## 2019-03-29 DIAGNOSIS — E6609 Other obesity due to excess calories: Secondary | ICD-10-CM | POA: Diagnosis not present

## 2019-03-29 DIAGNOSIS — J302 Other seasonal allergic rhinitis: Secondary | ICD-10-CM | POA: Diagnosis not present

## 2019-03-29 DIAGNOSIS — R42 Dizziness and giddiness: Secondary | ICD-10-CM | POA: Diagnosis not present

## 2019-11-11 ENCOUNTER — Encounter: Payer: Self-pay | Admitting: *Deleted

## 2019-11-14 ENCOUNTER — Encounter: Payer: Self-pay | Admitting: *Deleted

## 2019-11-30 ENCOUNTER — Other Ambulatory Visit: Payer: Self-pay

## 2019-11-30 DIAGNOSIS — Z20822 Contact with and (suspected) exposure to covid-19: Secondary | ICD-10-CM

## 2019-12-02 ENCOUNTER — Telehealth: Payer: Self-pay

## 2019-12-02 NOTE — Telephone Encounter (Signed)
Pt. Checking on COVID 19 test results. Not available yet. 

## 2019-12-03 LAB — NOVEL CORONAVIRUS, NAA: SARS-CoV-2, NAA: DETECTED — AB

## 2019-12-14 ENCOUNTER — Ambulatory Visit: Payer: Self-pay

## 2020-01-10 ENCOUNTER — Ambulatory Visit: Payer: Self-pay

## 2020-01-30 ENCOUNTER — Other Ambulatory Visit: Payer: Self-pay | Admitting: Neurosurgery

## 2020-01-30 DIAGNOSIS — M4722 Other spondylosis with radiculopathy, cervical region: Secondary | ICD-10-CM

## 2020-01-30 DIAGNOSIS — M545 Low back pain, unspecified: Secondary | ICD-10-CM

## 2020-02-23 ENCOUNTER — Ambulatory Visit (INDEPENDENT_AMBULATORY_CARE_PROVIDER_SITE_OTHER): Payer: Self-pay | Admitting: *Deleted

## 2020-02-23 DIAGNOSIS — Z1211 Encounter for screening for malignant neoplasm of colon: Secondary | ICD-10-CM

## 2020-02-23 MED ORDER — NA SULFATE-K SULFATE-MG SULF 17.5-3.13-1.6 GM/177ML PO SOLN: 1.0000 | Freq: Once | ORAL | 0 refills | Status: AC

## 2020-02-23 NOTE — Progress Notes (Addendum)
Gastroenterology Pre-Procedure Review  Request Date: 02/23/2020 Requesting Physician: Dr. Phillips Odor, Last TCS 08/05/2005 done by Dr. Jena Gauss, no polyps  PATIENT REVIEW QUESTIONS: The patient responded to the following health history questions as indicated:    1. Diabetes Melitis: no 2. Joint replacements in the past 12 months: no 3. Major health problems in the past 3 months: no 4. Has an artificial valve or MVP: no 5. Has a defibrillator: no 6. Has been advised in past to take antibiotics in advance of a procedure like teeth cleaning: no 7. Family history of colon cancer: no  8. Alcohol Use: yes, 1 mixed drink every 3 months 9. Illicit drug Use: no 10. History of sleep apnea: no  11. History of coronary artery or other vascular stents placed within the last 12 months: no 12. History of any prior anesthesia complications: yes, n/v 13. There is no height or weight on file to calculate BMI. ht: 5'6 wt: 182 lbs    MEDICATIONS & ALLERGIES:    Patient reports the following regarding taking any blood thinners:   Plavix? no Aspirin? yes Coumadin? no Brilinta? no Xarelto? no Eliquis? no Pradaxa? no Savaysa? no Effient? no  Patient confirms/reports the following medications:  Current Outpatient Medications  Medication Sig Dispense Refill  . albuterol (VENTOLIN HFA) 108 (90 Base) MCG/ACT inhaler Inhale into the lungs as needed for wheezing or shortness of breath.    Marland Kitchen aspirin EC 81 MG tablet Take 81 mg by mouth daily.    Marland Kitchen atenolol (TENORMIN) 25 MG tablet Take 25 mg by mouth daily.    Marland Kitchen BLACK COHOSH EXTRACT PO Take by mouth daily.     Marland Kitchen escitalopram (LEXAPRO) 10 MG tablet Take 10 mg by mouth as needed.    . fexofenadine-pseudoephedrine (ALLEGRA-D 24) 180-240 MG 24 hr tablet Take 1 tablet by mouth daily.    Marland Kitchen levothyroxine (SYNTHROID, LEVOTHROID) 25 MCG tablet Take 25 mcg by mouth daily before breakfast.    . meclizine (ANTIVERT) 25 MG tablet Take 25 mg by mouth as needed for dizziness.     . medroxyPROGESTERone (PROVERA) 10 MG tablet Take 1 tablet (10 mg total) by mouth daily. 10 tablet 0  . pravastatin (PRAVACHOL) 20 MG tablet Take 20 mg by mouth daily.     No current facility-administered medications for this visit.    Patient confirms/reports the following allergies:  Allergies  Allergen Reactions  . Latex Anaphylaxis    No orders of the defined types were placed in this encounter.   AUTHORIZATION INFORMATION Primary Insurance: [place] ,  [place] #:[id number],  Group #: [id number] Pre-Cert / Berkley Harvey required: Pre-Cert / Auth #:   SCHEDULE INFORMATION: Procedure has been scheduled as follows:  Date: 05/11/2020, Time: 12:00 Location: [facility] with Dr. Jena Gauss  This Gastroenterology Pre-Precedure Review Form is being routed to the following provider(s): Tana Coast, PA-C

## 2020-02-23 NOTE — Patient Instructions (Signed)
[name]  06/30/1966 MRN: [id number]     Procedure Date:  05/11/2020 Time to register: 11:00 am Place to register: [facility] Procedure Time: 12:00 pm Scheduled provider: Dr. Gala Romney    PREPARATION FOR COLONOSCOPY WITH SUPREP BOWEL PREP KIT  Note: Suprep Bowel Prep Kit is a split-dose (2day) regimen. Consumption of BOTH 6-ounce bottles is required for a complete prep.  Please notify us immediately if you are diabetic, take iron supplements, or if you are on Coumadin or any other blood thinners.  Please hold the following medications: n/a                                                                                                                                                  2 DAYS BEFORE PROCEDURE:  DATE: 05/09/2020   DAY: Wednesday Begin clear liquid diet AFTER your lunch meal. NO SOLID FOODS after this point.  1 DAY BEFORE PROCEDURE:  DATE: 05/10/2020   DAY: Thursday Continue clear liquids the entire day - NO SOLID FOOD.   Diabetic medications adjustments for today: n/a  At 6:00pm: Complete steps 1 through 4 below, using ONE (1) 6-ounce bottle, before going to bed. Step 1:  Pour ONE (1) 6-ounce bottle of SUPREP liquid into the mixing container.  Step 2:  Add cool drinking water to the 16 ounce line on the container and mix.  Note: Dilute the solution concentrate as directed prior to use. Step 3:  DRINK ALL the liquid in the container. Step 4:  You MUST drink an additional two (2) or more 16 ounce containers of water over the next one (1) hour.   Continue clear liquids.  DAY OF PROCEDURE:   DATE: 05/11/2020  DAY: Friday If you take medications for your heart, blood pressure, or breathing, you may take these medications.  Diabetic medications adjustments for today: n/a  5 hours before your procedure at:  7:00 am Step 1:  Pour ONE (1) 6-ounce bottle of SUPREP liquid into the mixing container.  Step 2:  Add cool drinking water to the 16 ounce line on the  container and mix.  Note: Dilute the solution concentrate as directed prior to use. Step 3:  DRINK ALL the liquid in the container. Step 4:  You MUST drink an additional two (2) or more 16 ounce containers of water over the next one (1) hour. You MUST complete the final glass of water at least 3 hours before your colonoscopy. Nothing by mouth past 9:00 am.  You may take your morning medications with sip of water unless we have instructed otherwise.    Please see below for Dietary Information.  CLEAR LIQUIDS INCLUDE:  Water Jello (NOT red in color)   Ice Popsicles (NOT red in color)   Tea (sugar ok, no milk/cream) Powdered fruit flavored drinks  Coffee (sugar ok,  no milk/cream) Gatorade/ Lemonade/ Kool-Aid  (NOT red in color)   Juice: apple, white grape, white cranberry Soft drinks  Clear bullion, consomme, broth (fat free beef/chicken/vegetable)  Carbonated beverages (any kind)  Strained chicken noodle soup Hard Candy   Remember: Clear liquids are liquids that will allow you to see your fingers on the other side of a clear glass. Be sure liquids are NOT red in color, and not cloudy, but CLEAR.  DO NOT EAT OR DRINK ANY OF THE FOLLOWING:  Dairy products of any kind   Cranberry juice Tomato juice / V8 juice   Grapefruit juice Orange juice     Red grape juice  Do not eat any solid foods, including such foods as: cereal, oatmeal, yogurt, fruits, vegetables, creamed soups, eggs, bread, crackers, pureed foods in a blender, etc.   HELPFUL HINTS FOR DRINKING PREP SOLUTION:   Make sure prep is extremely cold. Mix and refrigerate the the morning of the prep. You may also put in the freezer.   You may try mixing some Crystal Light or Country Time Lemonade if you prefer. Mix in small amounts; add more if necessary.  Try drinking through a straw  Rinse mouth with water or a mouthwash between glasses, to remove after-taste.  Try sipping on a cold beverage /ice/ popsicles between glasses of  prep.  Place a piece of sugar-free hard candy in mouth between glasses.  If you become nauseated, try consuming smaller amounts, or stretch out the time between glasses. Stop for 30-60 minutes, then slowly start back drinking.     OTHER INSTRUCTIONS  You will need a responsible adult at least 53 years of age to accompany you and drive you home. This person must remain in the waiting room during your procedure. The hospital will cancel your procedure if you do not have a responsible adult with you.   1. Wear loose fitting clothing that is easily removed. 2. Leave jewelry and other valuables at home.  3. Remove all body piercing jewelry and leave at home. 4. Total time from sign-in until discharge is approximately 2-3 hours. 5. You should go home directly after your procedure and rest. You can resume normal activities the day after your procedure. 6. The day of your procedure you should not:  Drive  Make legal decisions  Operate machinery  Drink alcohol  Return to work   You may call the office (Dept: [phone]) before 5:00pm, or page the doctor on call ([phone]) after 5:00pm, for further instructions, if necessary.   Insurance Information YOU WILL NEED TO CHECK WITH YOUR INSURANCE COMPANY FOR THE BENEFITS OF COVERAGE YOU HAVE FOR THIS PROCEDURE.  UNFORTUNATELY, NOT ALL INSURANCE COMPANIES HAVE BENEFITS TO COVER ALL OR PART OF THESE TYPES OF PROCEDURES.  IT IS YOUR RESPONSIBILITY TO CHECK YOUR BENEFITS, HOWEVER, WE WILL BE GLAD TO ASSIST YOU WITH ANY CODES YOUR INSURANCE COMPANY MAY NEED.    PLEASE NOTE THAT MOST INSURANCE COMPANIES WILL NOT COVER A SCREENING COLONOSCOPY FOR PEOPLE UNDER THE AGE OF 50  IF YOU HAVE BCBS INSURANCE, YOU MAY HAVE BENEFITS FOR A SCREENING COLONOSCOPY BUT IF POLYPS ARE FOUND THE DIAGNOSIS WILL CHANGE AND THEN YOU MAY HAVE A DEDUCTIBLE THAT WILL NEED TO BE MET. SO PLEASE MAKE SURE YOU CHECK YOUR BENEFITS FOR A SCREENING COLONOSCOPY AS WELL AS A  DIAGNOSTIC COLONOSCOPY.      

## 2020-02-23 NOTE — Progress Notes (Signed)
Pt said she forgot to tell me that she takes a daily ASA 81 mg.  She remembered when we got to the blood thinner section of the triage form.  Added ASA 81 mg to medication list.

## 2020-02-27 ENCOUNTER — Ambulatory Visit
Admission: RE | Admit: 2020-02-27 | Discharge: 2020-02-27 | Disposition: A | Discharge status: Home / Self Care | Payer: 59 | Source: Ambulatory Visit | Attending: Neurosurgery | Admitting: Neurosurgery

## 2020-02-27 ENCOUNTER — Other Ambulatory Visit: Payer: Self-pay

## 2020-02-27 DIAGNOSIS — M4722 Other spondylosis with radiculopathy, cervical region: Secondary | ICD-10-CM

## 2020-02-27 DIAGNOSIS — M545 Low back pain, unspecified: Secondary | ICD-10-CM

## 2020-02-27 MED ORDER — GADOBENATE DIMEGLUMINE 529 MG/ML IV SOLN
20.0000 mL | Freq: Once | INTRAVENOUS | Status: AC | PRN
  Administered 2020-02-27: 17:00:00 20 mL via INTRAVENOUS

## 2020-02-27 NOTE — Addendum Note (Signed)
Addended by: Tiffany Kocher on: 02/27/2020 07:53 AM   Modules accepted: Orders

## 2020-02-27 NOTE — Progress Notes (Signed)
OK to schedule

## 2020-02-28 ENCOUNTER — Telehealth: Payer: Self-pay | Admitting: *Deleted

## 2020-02-28 NOTE — Telephone Encounter (Signed)
Called pt and lmom for pt to call us back.

## 2020-02-28 NOTE — Telephone Encounter (Addendum)
Pt called back.  Made her aware that Victorino Dike from [facility] called to follow up on her prior authorization.  It was denied.  Pt will need to have procedure done at a GI ambulatory facility (per Victorino Dike), in order for it to be covered.  REF#: [id number]. Pt voiced understanding and requested to cancel since it won't be covered at an outpatient hospital.  Pt made aware to call her PCP to send a referral to  GI.  We will notify PCP as well.

## 2020-02-28 NOTE — Telephone Encounter (Signed)
noted 

## 2020-04-01 IMAGING — MR MR CERVICAL SPINE W/O CM
4 of 5 series · 29 of 48 positions shown · non-contrast
Comparison: Prior MRI from 03/30/2015.

CLINICAL DATA: Initial evaluation for posterior neck pain with
numbness in hands and muscle spasms for 4 months.

EXAM:
MRI CERVICAL SPINE WITHOUT CONTRAST
TECHNIQUE: Multiplanar, multisequence MR imaging of the cervical spine was
performed. No intravenous contrast was administered.

[Series 3: T2 · sagittal · 3.0mm · 0.66mm/px · 8 of 14 slices shown (1 of 2)]
[im 1/14]
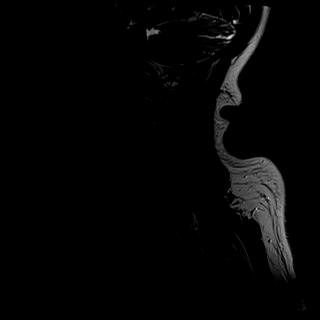
[im 2/14]
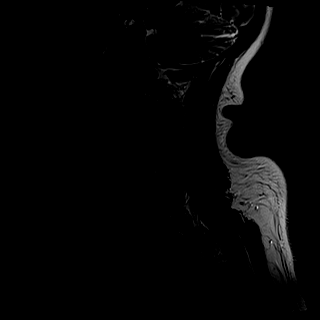
[im 4/14]
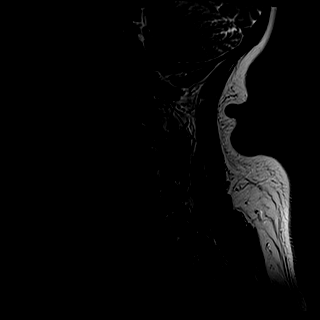
[im 6/14]
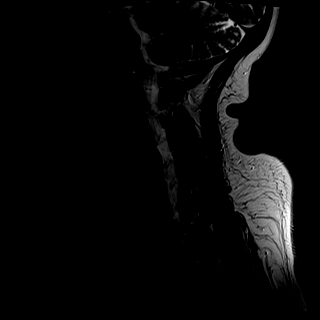
[im 8/14]
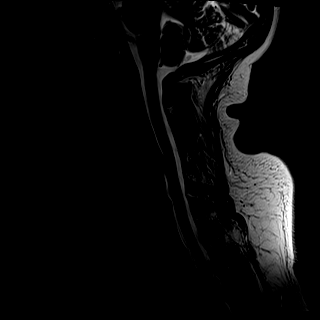
[im 10/14]
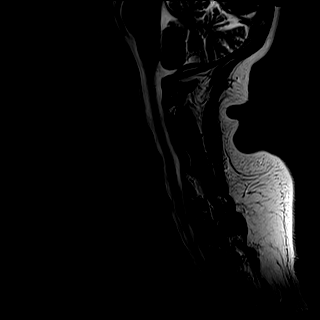
[im 12/14]
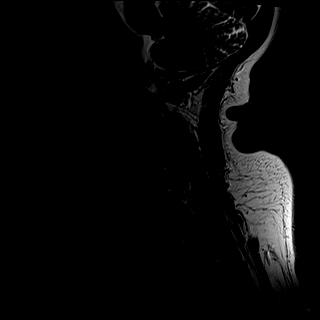
[im 14/14]
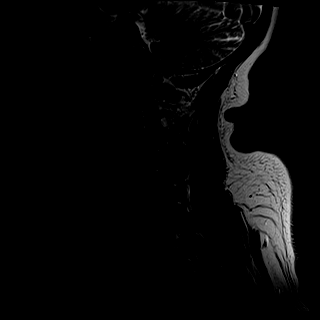

[Series 4: T1 · sagittal · 3.0mm · 0.41mm/px · 7 of 14 slices shown]
[im 1/14]
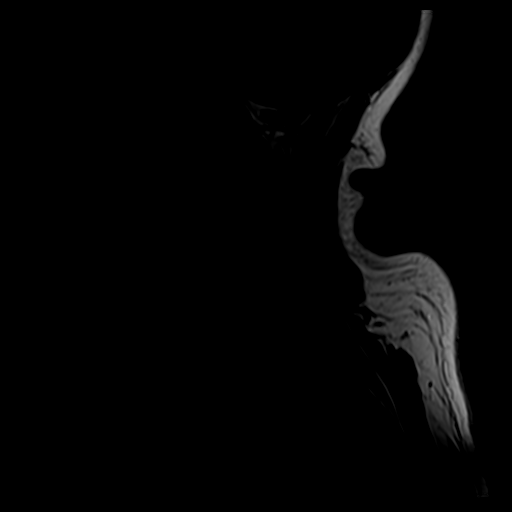
[im 3/14]
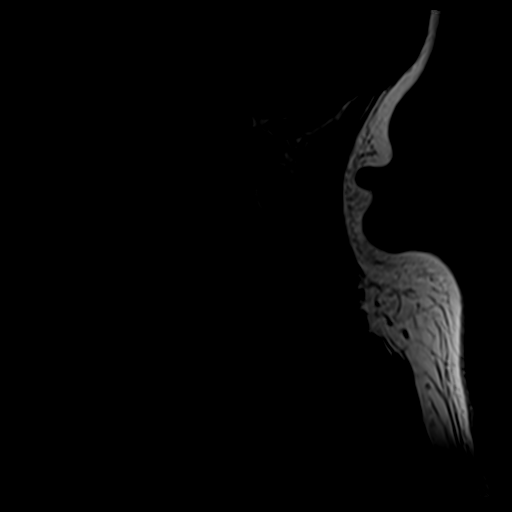
[im 5/14]
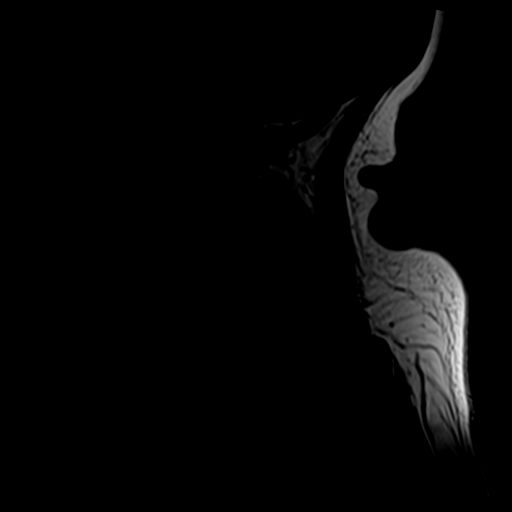
[im 7/14]
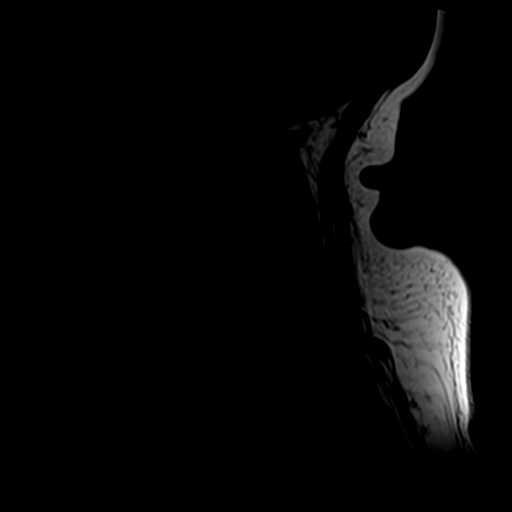
[im 9/14]
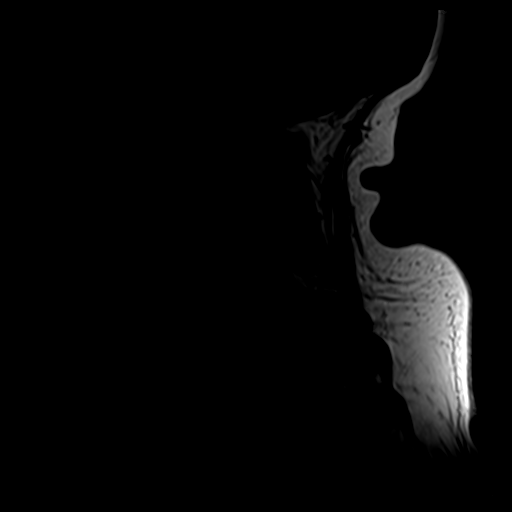
[im 11/14]
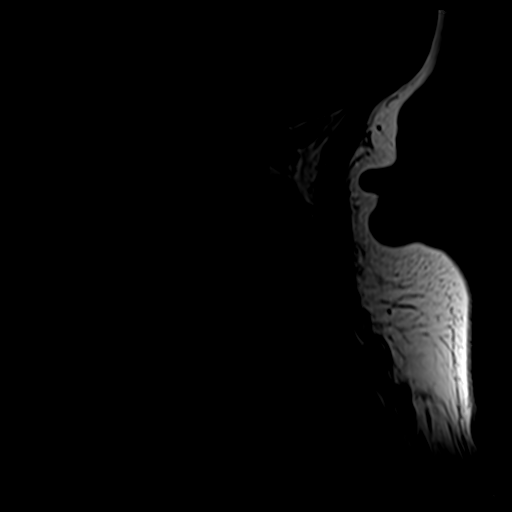
[im 14/14]
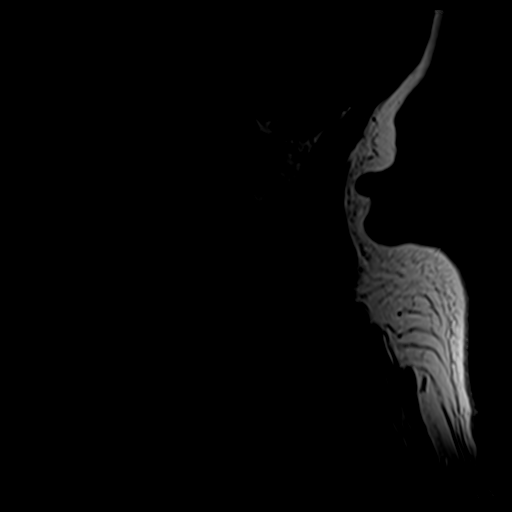

[Series 5: tir sag · sagittal · 3.0mm · 0.41mm/px · 5 of 14 slices shown]
[im 1/14]
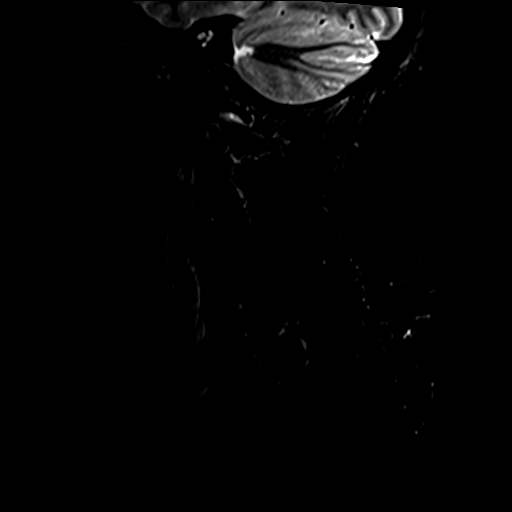
[im 3/14]
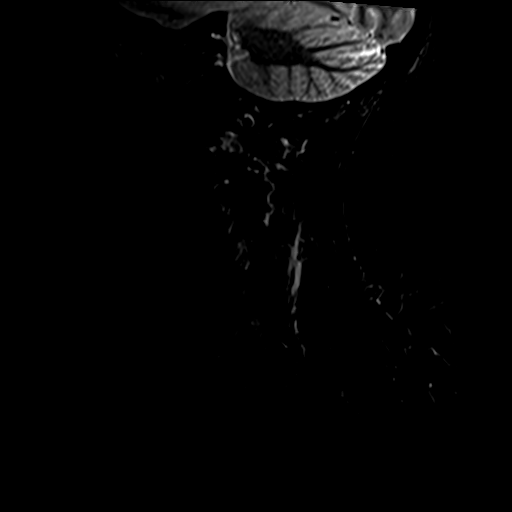
[im 5/14]
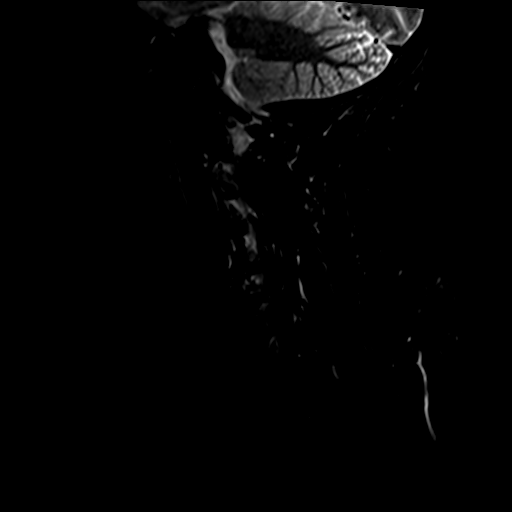
[im 7/14]
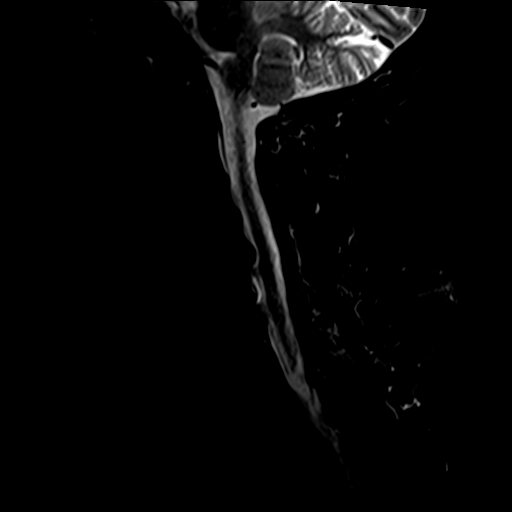
[im 11/14]
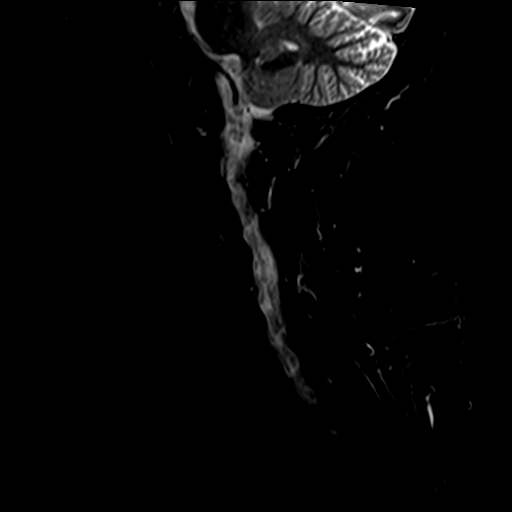

[Series 7: T2 · axial · 3.0mm · 0.70mm/px · z∈[-55,+33]mm · 9 of 25 slices shown (2 of 2)]
[im 1/25]
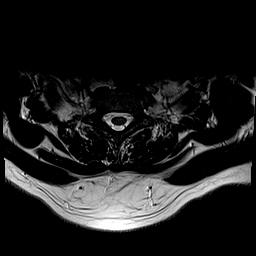
[im 5/25]
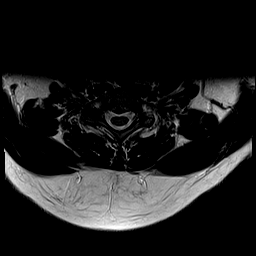
[im 9/25]
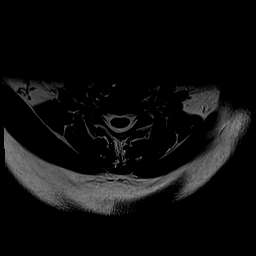
[im 11/25]
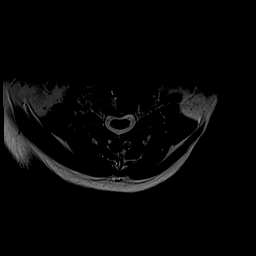
[im 13/25]
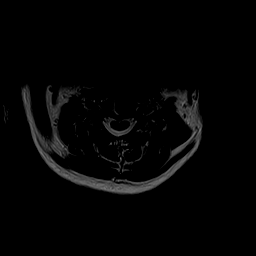
[im 15/25]
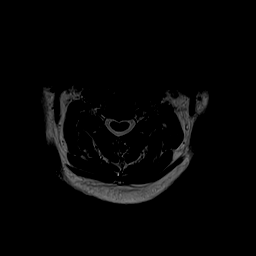
[im 17/25]
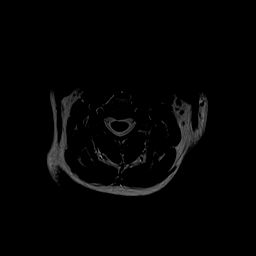
[im 21/25]
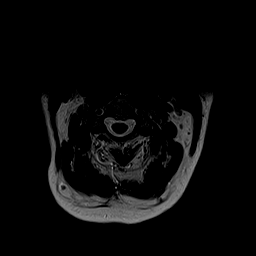
[im 25/25]
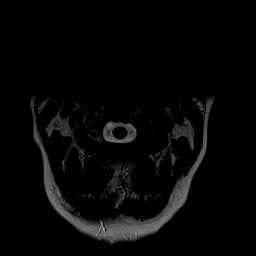

[29 of 48 positions shown; findings below may reference images not displayed]

FINDINGS: Alignment: Straightening with mild reversal of the normal cervical
lordosis, apex at C4-5. Trace anterolisthesis of C4 on C5, chronic
and degenerative.

Vertebrae: Susceptibility artifact from prior ACDF at C5-C6.
Vertebral body height maintained without evidence for acute or
chronic fracture. Bone marrow signal intensity within normal limits.
No discrete or worrisome osseous lesions. No abnormal marrow edema.

Cord: Signal intensity within the cervical spinal cord is normal.

Posterior Fossa, vertebral arteries, paraspinal tissues: Visualized
brain and posterior fossa within normal limits. Craniocervical
junction normal. Paraspinous and prevertebral soft tissues within
normal limits. Normal intravascular flow voids seen within the
vertebral arteries bilaterally.

Disc levels:

C2-C3: Minimal endplate osseous ridging. Otherwise unremarkable
without significant stenosis.

C3-C4: Shallow posterior disc bulge. Flattening of the ventral
thecal sac without significant spinal stenosis or cord deformity.
Foramina remain patent.

C4-C5: Central disc protrusion indents the ventral thecal sac,
contacting and mildly flattening the ventral spinal cord. Mild
spinal stenosis. Superimposed mild uncinate spurring without
significant foraminal narrowing.

C5-C6:  Prior fusion. No residual canal or foraminal stenosis.

C6-C7: Diffuse disc bulge, eccentric to the right, flattening and
partially effacing the ventral thecal sac. Mild spinal stenosis
without cord deformity. Superimposed bilateral uncovertebral
hypertrophy without significant foraminal stenosis.

C7-T1:  Mild facet hypertrophy. Otherwise unremarkable. No stenosis.

Visualized upper thoracic spine demonstrates no significant finding.
IMPRESSION: 1. Prior ACDF at C5-6 without residual canal or foraminal stenosis.
2. Adjacent segment disease with small central disc protrusion at
C4-5 with resultant mild spinal stenosis.
3. Broad right eccentric disc bulge at C6-7 with resultant mild
spinal stenosis.

## 2020-04-04 ENCOUNTER — Telehealth: Payer: Self-pay | Admitting: Gastroenterology

## 2020-04-04 NOTE — Telephone Encounter (Signed)
Unable to see any recent office notes or procedures from Dr. Luvenia Starch office in epic.  Can you please request them so I can review thank you.

## 2020-04-04 NOTE — Telephone Encounter (Signed)
Patient requesting to transfer care to our office.  Records are in Epic.  Patient states that insurance will not cover visits and procedures at [facility].  Please advise on scheduling.

## 2020-04-12 NOTE — Telephone Encounter (Signed)
Left message for pt to request records be sent to [place].

## 2020-04-30 ENCOUNTER — Other Ambulatory Visit: Payer: Self-pay | Admitting: Family Medicine

## 2020-04-30 DIAGNOSIS — Z1231 Encounter for screening mammogram for malignant neoplasm of breast: Secondary | ICD-10-CM

## 2020-05-10 ENCOUNTER — Other Ambulatory Visit (HOSPITAL_COMMUNITY): Payer: 59

## 2021-05-31 ENCOUNTER — Other Ambulatory Visit: Payer: Self-pay | Admitting: Orthopaedic Surgery

## 2021-05-31 DIAGNOSIS — M25511 Pain in right shoulder: Secondary | ICD-10-CM

## 2021-06-16 ENCOUNTER — Other Ambulatory Visit: Payer: Self-pay

## 2021-06-16 ENCOUNTER — Ambulatory Visit
Admission: RE | Admit: 2021-06-16 | Discharge: 2021-06-16 | Disposition: A | Discharge status: Home / Self Care | Payer: 59 | Source: Ambulatory Visit | Attending: Orthopaedic Surgery | Admitting: Orthopaedic Surgery

## 2021-06-16 DIAGNOSIS — M25511 Pain in right shoulder: Secondary | ICD-10-CM

## 2021-11-28 ENCOUNTER — Other Ambulatory Visit: Payer: Self-pay | Admitting: Orthopedic Surgery

## 2021-12-09 ENCOUNTER — Other Ambulatory Visit: Payer: Self-pay

## 2021-12-09 ENCOUNTER — Encounter (HOSPITAL_BASED_OUTPATIENT_CLINIC_OR_DEPARTMENT_OTHER): Payer: Self-pay | Admitting: Orthopedic Surgery

## 2021-12-12 ENCOUNTER — Encounter (HOSPITAL_BASED_OUTPATIENT_CLINIC_OR_DEPARTMENT_OTHER)
Admission: RE | Admit: 2021-12-12 | Discharge: 2021-12-12 | Disposition: A | Discharge status: Home / Self Care | Payer: 59 | Source: Ambulatory Visit | Attending: Orthopedic Surgery | Admitting: Orthopedic Surgery

## 2021-12-12 DIAGNOSIS — Z01810 Encounter for preprocedural cardiovascular examination: Secondary | ICD-10-CM | POA: Diagnosis present

## 2021-12-12 NOTE — H&P (Signed)
Primary Care Provider: Dr. Assunta Found Referring Provider: Dr. Charlie Pitter Comp: No Date of Injury or Onset: Chronic  History: CC / Reason for Visit: Right wrist pain HPI: This patient returns reevaluation, indicating that the radiocarpal joint injection provided on 10-15-21 provided some improvement in symptoms for 3-4 days or so.  Now it is about the same as it was prior to that.  She reminds me also that her thumb surgery was performed in March, shoulder surgery in July, and she has not been to work since.  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  She does not feel as if her shoulder and thumb have yet improved sufficiently for her to return to her regular duties, and now she is also having a dorsal wrist pain for which I evaluated her last month for the first time.  Her work does not have short-term disability, but they do have long-term disability.  HPI 10-15-21: This patient is a 55 year old, right-hand-dominant, female who is currently unemployed who indicates that she had a thumb suspension arthroplasty in March 2022 with Dr. Jerl Santos.  She has continued in therapy, but notes that she has pain in her wrist especially with weighted wrist extension.  She does not know of any accident or injury, but the wrist pain came on gradually.  She takes Celebrex on a daily basis.  She denies any history of having had a rheumatological workup, but notes that she has had over 10 surgeries for multiple different areas of arthritic changes.  She is here today at the request of Dr. Jerl Santos for further evaluation and treatment.  Review of systems as related to current complaint reviewed and unchanged.  Exam:  Vitals: Refer to EMR. Constitutional:  WD, WN, NAD HEENT:  NCAT, EOMI Neuro/Psych:  Alert & oriented to person, place, and time; appropriate mood & affect Lymphatic: No generalized UE edema or lymphadenopathy Extremities / MSK:  Both UE are normal with respect to  appearance, ranges of motion, joint stability, muscle strength/tone, sensation, & perfusion except as otherwise noted:  There is a scar on the right hand that is well healed along the thenar aspect of the thumb.  She does have full digital motion although the MP joints and PIP joints of the index and long fingers are painful.  There is tenderness to palpation directly over the radiocarpal joint which is intensified with forced wrist extension.  With flexion and extension, and thumb pressure on the dorsum of the wrists there is some crepitus that can be appreciated in the region of the scapholunate interval.  The crepitus feels soft tissue.  There is tenderness also with Watson's.  Light touch sensibility is intact.  Fingers are warm and well perfused.  Grip strength in position with 2: Right 55/left 70  Labs / Xrays:  No radiographic studies obtained today.  X-rays including 3 views of the  R wrist taken in September 2022 were evaluated and demonstrate scapholunate widening as well as some beaking of the radius.  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  We discussed options.  She would like to proceed with arthroscopic evaluation and likely scapholunate ligament/interval debridement.  Generally speaking, postoperative work restrictions for 4-6 weeks would be appropriate.  She has apparently been out of work since prior to her thumb surgery in March and has questions related to this and eventually receiving documentation to assist in satisfying  requirements to receive long-term disability.  I indicated that I would confer with Dr. Nolon Nations team regarding those issues, and that perhaps they would be best addressed during reevaluation with them for her shoulder/thumb.  With regard to her dorsal wrist pain, we will plan to proceed with arthroscopic evaluation and likely debridement at a time that suits her best.

## 2021-12-15 NOTE — Anesthesia Preprocedure Evaluation (Addendum)
Anesthesia Evaluation  Patient identified by MRN, date of birth, ID band Patient awake    Reviewed: Allergy & Precautions, NPO status , Patient's Chart, lab work & pertinent test results  History of Anesthesia Complications (+) PONV and history of anesthetic complications  Airway Mallampati: II  TM Distance: >3 FB Neck ROM: Full    Dental no notable dental hx. (+) Dental Advisory Given   Pulmonary neg pulmonary ROS,    Pulmonary exam normal        Cardiovascular hypertension, Pt. on home beta blockers Normal cardiovascular exam     Neuro/Psych negative neurological ROS     GI/Hepatic negative GI ROS, Neg liver ROS,   Endo/Other  Hypothyroidism   Renal/GU negative Renal ROS     Musculoskeletal negative musculoskeletal ROS (+)   Abdominal   Peds  Hematology negative hematology ROS (+)   Anesthesia Other Findings   Reproductive/Obstetrics                            Anesthesia Physical Anesthesia Plan  ASA: 2  Anesthesia Plan: MAC   Post-op Pain Management: Tylenol PO (pre-op), Celebrex PO (pre-op) and Regional block   Induction:   PONV Risk Score and Plan: 3 and Ondansetron, Dexamethasone, TIVA and Midazolam  Airway Management Planned: Natural Airway and Simple Face Mask  Additional Equipment:   Intra-op Plan:   Post-operative Plan:   Informed Consent: I have reviewed the patients History and Physical, chart, labs and discussed the procedure including the risks, benefits and alternatives for the proposed anesthesia with the patient or authorized representative who has indicated his/her understanding and acceptance.     Dental advisory given  Plan Discussed with: Anesthesiologist and CRNA  Anesthesia Plan Comments:        Anesthesia Quick Evaluation

## 2021-12-16 ENCOUNTER — Ambulatory Visit (HOSPITAL_BASED_OUTPATIENT_CLINIC_OR_DEPARTMENT_OTHER): Payer: 59 | Admitting: Anesthesiology

## 2021-12-16 ENCOUNTER — Encounter (HOSPITAL_BASED_OUTPATIENT_CLINIC_OR_DEPARTMENT_OTHER)
Admission: RE | Disposition: A | Discharge status: Home / Self Care | Payer: Self-pay | Source: Home / Self Care | Attending: Orthopedic Surgery

## 2021-12-16 ENCOUNTER — Encounter (HOSPITAL_BASED_OUTPATIENT_CLINIC_OR_DEPARTMENT_OTHER): Payer: Self-pay | Admitting: Orthopedic Surgery

## 2021-12-16 ENCOUNTER — Ambulatory Visit (HOSPITAL_BASED_OUTPATIENT_CLINIC_OR_DEPARTMENT_OTHER)
Admission: RE | Admit: 2021-12-16 | Discharge: 2021-12-16 | Disposition: A | Discharge status: Home / Self Care | Payer: 59 | Attending: Orthopedic Surgery | Admitting: Orthopedic Surgery

## 2021-12-16 ENCOUNTER — Other Ambulatory Visit: Payer: Self-pay

## 2021-12-16 DIAGNOSIS — M19031 Primary osteoarthritis, right wrist: Secondary | ICD-10-CM | POA: Insufficient documentation

## 2021-12-16 HISTORY — DX: Nausea with vomiting, unspecified: R11.2

## 2021-12-16 HISTORY — DX: Nausea with vomiting, unspecified: Z98.890

## 2021-12-16 HISTORY — PX: WRIST ARTHROSCOPY WITH DEBRIDEMENT: SHX6194

## 2021-12-16 SURGERY — WRIST ARTHROSCOPY WITH DEBRIDEMENT
Anesthesia: Monitor Anesthesia Care | Site: Wrist | Laterality: Right

## 2021-12-16 MED ORDER — ONDANSETRON HCL 4 MG/2ML IJ SOLN
INTRAMUSCULAR | Status: AC
  Filled 2021-12-16: qty 2

## 2021-12-16 MED ORDER — MIDAZOLAM HCL 2 MG/2ML IJ SOLN
INTRAMUSCULAR | Status: AC
  Filled 2021-12-16: qty 2

## 2021-12-16 MED ORDER — SCOPOLAMINE 1 MG/3DAYS TD PT72
MEDICATED_PATCH | TRANSDERMAL | Status: AC
  Filled 2021-12-16: qty 1

## 2021-12-16 MED ORDER — LIDOCAINE HCL (CARDIAC) PF 100 MG/5ML IV SOSY
PREFILLED_SYRINGE | INTRAVENOUS | Status: DC | PRN
  Administered 2021-12-16 (×2): 50 mg via INTRAVENOUS

## 2021-12-16 MED ORDER — CELECOXIB 200 MG PO CAPS
200.0000 mg | ORAL_CAPSULE | Freq: Once | ORAL | Status: AC
  Administered 2021-12-16: 07:00:00 200 mg via ORAL

## 2021-12-16 MED ORDER — BUPIVACAINE HCL (PF) 0.25 % IJ SOLN
INTRAMUSCULAR | Status: AC
  Filled 2021-12-16: qty 60

## 2021-12-16 MED ORDER — DEXAMETHASONE SODIUM PHOSPHATE 10 MG/ML IJ SOLN
INTRAMUSCULAR | Status: DC | PRN
  Administered 2021-12-16: 08:00:00 5 mg

## 2021-12-16 MED ORDER — LIDOCAINE 2% (20 MG/ML) 5 ML SYRINGE
INTRAMUSCULAR | Status: AC
  Filled 2021-12-16: qty 5

## 2021-12-16 MED ORDER — FENTANYL CITRATE (PF) 100 MCG/2ML IJ SOLN
INTRAMUSCULAR | Status: AC
  Filled 2021-12-16: qty 2

## 2021-12-16 MED ORDER — SODIUM CHLORIDE 0.9 % IR SOLN
Status: DC | PRN
  Administered 2021-12-16: 1700 mL

## 2021-12-16 MED ORDER — AMISULPRIDE (ANTIEMETIC) 5 MG/2ML IV SOLN: 10.0000 mg | Freq: Once | INTRAVENOUS | Status: DC | PRN

## 2021-12-16 MED ORDER — IBUPROFEN 200 MG PO TABS: 600.0000 mg | ORAL_TABLET | Freq: Four times a day (QID) | ORAL | Status: DC

## 2021-12-16 MED ORDER — FENTANYL CITRATE (PF) 100 MCG/2ML IJ SOLN: 25.0000 ug | INTRAMUSCULAR | Status: DC | PRN

## 2021-12-16 MED ORDER — PROPOFOL 500 MG/50ML IV EMUL
INTRAVENOUS | Status: DC | PRN
  Administered 2021-12-16: 100 ug/kg/min via INTRAVENOUS

## 2021-12-16 MED ORDER — PROPOFOL 10 MG/ML IV BOLUS
INTRAVENOUS | Status: DC | PRN
  Administered 2021-12-16: 50 mg via INTRAVENOUS

## 2021-12-16 MED ORDER — LACTATED RINGERS IV SOLN: INTRAVENOUS | Status: DC

## 2021-12-16 MED ORDER — PROMETHAZINE HCL 25 MG/ML IJ SOLN: 6.2500 mg | INTRAMUSCULAR | Status: DC | PRN

## 2021-12-16 MED ORDER — ACETAMINOPHEN 500 MG PO TABS
1000.0000 mg | ORAL_TABLET | Freq: Once | ORAL | Status: AC
  Administered 2021-12-16: 06:00:00 1000 mg via ORAL

## 2021-12-16 MED ORDER — CEFAZOLIN SODIUM-DEXTROSE 2-4 GM/100ML-% IV SOLN: 2.0000 g | INTRAVENOUS | Status: DC

## 2021-12-16 MED ORDER — EPHEDRINE SULFATE 50 MG/ML IJ SOLN
INTRAMUSCULAR | Status: DC | PRN
  Administered 2021-12-16 (×3): 5 mg via INTRAVENOUS

## 2021-12-16 MED ORDER — CEFAZOLIN SODIUM-DEXTROSE 2-4 GM/100ML-% IV SOLN
INTRAVENOUS | Status: AC
  Filled 2021-12-16: qty 100

## 2021-12-16 MED ORDER — EPHEDRINE 5 MG/ML INJ
INTRAVENOUS | Status: AC
  Filled 2021-12-16: qty 5

## 2021-12-16 MED ORDER — DEXAMETHASONE SODIUM PHOSPHATE 10 MG/ML IJ SOLN
INTRAMUSCULAR | Status: AC
  Filled 2021-12-16: qty 1

## 2021-12-16 MED ORDER — FENTANYL CITRATE (PF) 100 MCG/2ML IJ SOLN
100.0000 ug | Freq: Once | INTRAMUSCULAR | Status: AC
  Administered 2021-12-16: 07:00:00 100 ug via INTRAVENOUS

## 2021-12-16 MED ORDER — OXYCODONE HCL 5 MG PO TABS: 5.0000 mg | ORAL_TABLET | Freq: Four times a day (QID) | ORAL | 0 refills | Status: DC | PRN

## 2021-12-16 MED ORDER — MIDAZOLAM HCL 2 MG/2ML IJ SOLN
2.0000 mg | Freq: Once | INTRAMUSCULAR | Status: AC
  Administered 2021-12-16: 07:00:00 2 mg via INTRAVENOUS

## 2021-12-16 MED ORDER — DEXAMETHASONE SODIUM PHOSPHATE 10 MG/ML IJ SOLN
INTRAMUSCULAR | Status: DC | PRN
  Administered 2021-12-16: 10 mg via INTRAVENOUS

## 2021-12-16 MED ORDER — PROPOFOL 500 MG/50ML IV EMUL
INTRAVENOUS | Status: AC
  Filled 2021-12-16: qty 50

## 2021-12-16 MED ORDER — ROPIVACAINE HCL 5 MG/ML IJ SOLN
INTRAMUSCULAR | Status: DC | PRN
  Administered 2021-12-16: 30 mL via PERINEURAL

## 2021-12-16 MED ORDER — BUPIVACAINE-EPINEPHRINE (PF) 0.25% -1:200000 IJ SOLN
INTRAMUSCULAR | Status: AC
  Filled 2021-12-16: qty 30

## 2021-12-16 MED ORDER — ACETAMINOPHEN 325 MG PO TABS: 650.0000 mg | ORAL_TABLET | Freq: Four times a day (QID) | ORAL | Status: DC

## 2021-12-16 MED ORDER — PROPOFOL 10 MG/ML IV BOLUS
INTRAVENOUS | Status: AC
  Filled 2021-12-16: qty 20

## 2021-12-16 MED ORDER — SCOPOLAMINE 1 MG/3DAYS TD PT72
1.0000 | MEDICATED_PATCH | TRANSDERMAL | Status: DC
  Administered 2021-12-16: 07:00:00 1.5 mg via TRANSDERMAL

## 2021-12-16 MED ORDER — CELECOXIB 200 MG PO CAPS
ORAL_CAPSULE | ORAL | Status: AC
  Filled 2021-12-16: qty 1

## 2021-12-16 MED ORDER — ACETAMINOPHEN 500 MG PO TABS
ORAL_TABLET | ORAL | Status: AC
  Filled 2021-12-16: qty 2

## 2021-12-16 MED ORDER — ONDANSETRON HCL 4 MG/2ML IJ SOLN
INTRAMUSCULAR | Status: DC | PRN
  Administered 2021-12-16: 4 mg via INTRAVENOUS

## 2021-12-16 SURGICAL SUPPLY — 67 items
ADH SKN CLS APL DERMABOND .7 (GAUZE/BANDAGES/DRESSINGS)
APL PRP STRL LF DISP 70% ISPRP (MISCELLANEOUS) ×2
APL SKNCLS STERI-STRIP NONHPOA (GAUZE/BANDAGES/DRESSINGS)
BENZOIN TINCTURE PRP APPL 2/3 (GAUZE/BANDAGES/DRESSINGS) IMPLANT
BLADE DISSECTOR 3.0X7 (BLADE) ×2 IMPLANT
BLADE MINI RND TIP GREEN BEAV (BLADE) IMPLANT
BLADE SURG 15 STRL LF DISP TIS (BLADE) ×2 IMPLANT
BLADE SURG 15 STRL SS (BLADE) ×3
BNDG CMPR 9X4 STRL LF SNTH (GAUZE/BANDAGES/DRESSINGS) ×2
BNDG COHESIVE 4X5 TAN ST LF (GAUZE/BANDAGES/DRESSINGS) ×3 IMPLANT
BNDG ESMARK 4X9 LF (GAUZE/BANDAGES/DRESSINGS) ×3 IMPLANT
BNDG GAUZE ELAST 4 BULKY (GAUZE/BANDAGES/DRESSINGS) ×3 IMPLANT
CANISTER SUCT 1200ML W/VALVE (MISCELLANEOUS) ×3 IMPLANT
CHLORAPREP W/TINT 26 (MISCELLANEOUS) ×3 IMPLANT
CORD BIPOLAR FORCEPS 12FT (ELECTRODE) IMPLANT
COVER BACK TABLE 60X90IN (DRAPES) ×3 IMPLANT
COVER MAYO STAND STRL (DRAPES) ×3 IMPLANT
CUFF TOURN SGL QUICK 18X4 (TOURNIQUET CUFF) ×3 IMPLANT
DECANTER SPIKE VIAL GLASS SM (MISCELLANEOUS) IMPLANT
DERMABOND ADVANCED (GAUZE/BANDAGES/DRESSINGS)
DERMABOND ADVANCED .7 DNX12 (GAUZE/BANDAGES/DRESSINGS) IMPLANT
DRAPE EXTREMITY T 121X128X90 (DISPOSABLE) ×3 IMPLANT
DRAPE OEC MINIVIEW 54X84 (DRAPES) IMPLANT
DRAPE SURG 17X23 STRL (DRAPES) ×1 IMPLANT
DRAPE U-SHAPE 47X51 STRL (DRAPES) ×2 IMPLANT
DRSG EMULSION OIL 3X3 NADH (GAUZE/BANDAGES/DRESSINGS) ×3 IMPLANT
DRSG TEGADERM 4X4.75 (GAUZE/BANDAGES/DRESSINGS) IMPLANT
GAUZE SPONGE 4X4 12PLY STRL LF (GAUZE/BANDAGES/DRESSINGS) ×3 IMPLANT
GLOVE SRG 8 PF TXTR STRL LF DI (GLOVE) ×2 IMPLANT
GLOVE SURG ENC MOIS LTX SZ7.5 (GLOVE) ×1 IMPLANT
GLOVE SURG LTX SZ6.5 (GLOVE) ×1 IMPLANT
GLOVE SURG POLYISO LF SZ6.5 (GLOVE) ×4 IMPLANT
GLOVE SURG SYN 7.5  E (GLOVE) ×3
GLOVE SURG SYN 7.5 E (GLOVE) ×2 IMPLANT
GLOVE SURG SYN 7.5 PF PI (GLOVE) IMPLANT
GLOVE SURG UNDER POLY LF SZ7 (GLOVE) ×3 IMPLANT
GLOVE SURG UNDER POLY LF SZ8 (GLOVE) ×3
GOWN STRL REUS W/ TWL LRG LVL3 (GOWN DISPOSABLE) ×4 IMPLANT
GOWN STRL REUS W/TWL LRG LVL3 (GOWN DISPOSABLE) ×6
GOWN STRL REUS W/TWL XL LVL3 (GOWN DISPOSABLE) ×3 IMPLANT
MANIFOLD NEPTUNE II (INSTRUMENTS) IMPLANT
NEEDLE HYPO 22GX1.5 SAFETY (NEEDLE) ×3 IMPLANT
PACK BASIN DAY SURGERY FS (CUSTOM PROCEDURE TRAY) ×3 IMPLANT
PADDING CAST ABS 4INX4YD NS (CAST SUPPLIES) ×1
PADDING CAST ABS COTTON 4X4 ST (CAST SUPPLIES) ×2 IMPLANT
PROBE BIPOLAR ARTHRO 85MM 30D (MISCELLANEOUS) IMPLANT
RETRIEVER SUT HEWSON (MISCELLANEOUS) IMPLANT
SET SM JOINT TUBING/CANN (CANNULA) ×3 IMPLANT
SHEET MEDIUM DRAPE 40X70 STRL (DRAPES) IMPLANT
SLING ARM FOAM STRAP LRG (SOFTGOODS) IMPLANT
SPLINT FIBERGLASS 3X35 (CAST SUPPLIES) ×3 IMPLANT
STOCKINETTE 6  STRL (DRAPES) ×3
STOCKINETTE 6 STRL (DRAPES) ×2 IMPLANT
STRIP CLOSURE SKIN 1/2X4 (GAUZE/BANDAGES/DRESSINGS) IMPLANT
SUT ETHIBOND 2 OS 4 DA (SUTURE) IMPLANT
SUT PDS AB 0 CT 36 (SUTURE) IMPLANT
SUT PDS AB 2-0 CT2 27 (SUTURE) IMPLANT
SUT VIC AB 3-0 FS2 27 (SUTURE) IMPLANT
SUT VICRYL RAPIDE 4/0 PS 2 (SUTURE) ×3 IMPLANT
SYR 10ML LL (SYRINGE) ×2 IMPLANT
SYR BULB EAR ULCER 3OZ GRN STR (SYRINGE) IMPLANT
SYR CONTROL 10ML LL (SYRINGE) ×3 IMPLANT
TOWEL GREEN STERILE FF (TOWEL DISPOSABLE) ×3 IMPLANT
TRAP DIGIT (INSTRUMENTS) ×2 IMPLANT
TRAP FINGER LRG (INSTRUMENTS) IMPLANT
TUBE CONNECTING 20X1/4 (TUBING) ×3 IMPLANT
WAND 1.5 MICROBLATOR (SURGICAL WAND) ×2 IMPLANT

## 2021-12-16 NOTE — Transfer of Care (Signed)
Immediate Anesthesia Transfer of Care Note  Patient: [name]  Procedure(s) Performed: RIGHT WRIST SCOPE AND DEBRIDEMENT (Right: Wrist)  Patient Location: PACU  Anesthesia Type:MAC  Level of Consciousness: awake, alert , oriented and patient cooperative  Airway & Oxygen Therapy: Patient Spontanous Breathing and Patient connected to nasal cannula oxygen  Post-op Assessment: Report given to RN, Post -op Vital signs reviewed and stable and Patient moving all extremities X 4  Post vital signs: Reviewed and stable  Last Vitals:  Vitals Value Taken Time  BP 106/71 12/16/21 0912  Temp 36.4 C 12/16/21 0912  Pulse 72 12/16/21 0914  Resp 16 12/16/21 0914  SpO2 93 % 12/16/21 0914  Vitals shown include unvalidated device data.  Last Pain:  Vitals:   12/16/21 0624  TempSrc: Oral  PainSc: 0-No pain         Complications: No notable events documented.

## 2021-12-16 NOTE — Op Note (Signed)
12/16/2021  7:39 AM  PATIENT:  [name]  55 y.o. female  PRE-OPERATIVE DIAGNOSIS: Right wrist pain with early SLAC arthritis  POST-OPERATIVE DIAGNOSIS:  Same  PROCEDURE: Right wrist arthroscopy, with debridement of the TFCC, SLIL, LTIL, and partial synovectomy  SURGEON: Rayvon Char. Grandville Silos, MD  PHYSICIAN ASSISTANT: Morley Kos, OPA-C  ANESTHESIA: Regional block/MAC  SPECIMENS:  None  DRAINS:   None  EBL:  less than 50 mL  PREOPERATIVE INDICATIONS:  [name] is a  56 y.o. female with right wrist pain and early slack arthritis.  She has fairly recently undergone thumb suspension arthroplasty for TMC arthritis, but remains with wrist pain and radiographic evidence for developing slack arthritis.  Her pain was largely dorsal central, with some soft tissue crepitus in the region, and was alleviated transiently with a fluoroscopically guided radiocarpal injection.  The risks benefits and alternatives were discussed with the patient preoperatively including but not limited to the risks of infection, bleeding, nerve injury, cardiopulmonary complications, the need for revision surgery, among others, and the patient verbalized understanding and consented to proceed.  OPERATIVE IMPLANTS: None  OPERATIVE PROCEDURE:  After receiving prophylactic antibiotics and a regional block, the patient was escorted to the operative theatre and placed in a supine position.  A surgical time-out was performed during which the planned procedure, proposed operative site, and the correct patient identity were compared to the operative consent and agreement confirmed by the circulating nurse according to current facility policy.  Following application of a tourniquet to the operative extremity, the exposed skin was prepped with Chloraprep and draped in the usual sterile fashion.  The limb was exsanguinated with an Esmarch bandage and the tourniquet inflated to approximately 168mHg higher than  systolic BP.  The right upper extremity was positioned in the L[employer]atec traction tower in standard fashion and the external landmarks highlighted as needed.  The joint was insufflated with irrigant, then a standard 3-4 viewing portal was established by first incising the skin, then with blunt spreading dissection to the level of the capsule where a hemostat was used to create the portal followed by introduction of the cannula with the blunt obturator.  The obturator was removed and the camera inserted, and a arthroscopic viewing commenced.  Initially, the SL IL ligament tear was identified.  In the dorsal and central portions of the ligament, it was largely detached from the scaphoid and displaceable proximally, hinged on the lunate, such that it could cause impingement.  I can place the width of the suction shaver into the cleft at both the SL IL and the LTIL intervals.  There was also some synovitic and thickened tissue in the dorsal gutter.  Chondral surfaces of the scaphoid, lunate, triquetrum, were reasonably good with minor scuffing.  There was a little bit more wear evident on the more radial aspect of the lunate.  There was a central tear of the TFCC, really just a partial-thickness perforation in the distal ulna was not visible through it.  There was synovitis in the ulnar compartment as well as the dorsal gutter and a little bit on the radial styloid and radial gutter.  Using needle localization, a 6R portal was established in standard fashion, spreading to the capsule/retinaculum and perforating it with a hemostat.  Suction shaver was inserted and the therapeutic portions of the procedure commenced.  Using combination of the suction shaver and the 30 degree micro ablator, the scapholunate ligament was debrided, the LT ligament was debrided.  The TFCC  incomplete central perforative tear was debrided, synovitis was resected and the ulnar and radial portions of the joint as well as along the dorsal  gutter.  The tendon from the thumb suspension was seen and found to be intact and not synovitic.    Once satisfied with debridement of these sites of pathology, final images were obtained and the arthroscopic instruments were removed.  Decision was made to let the portals remain open for drainage as needed.  A short arm bulky splint dressing was applied followed by a sling and she was taken to recovery room in stable condition.  DISPOSITION: She will be discharged home today with typical instructions, returning in 10 to 15 days for reevaluation and transition to a removable Velcro splint if needed.

## 2021-12-16 NOTE — Anesthesia Procedure Notes (Signed)
Anesthesia Regional Block: Supraclavicular block   Pre-Anesthetic Checklist: , timeout performed,  Correct Patient, Correct Site, Correct Laterality,  Correct Procedure, Correct Position, site marked,  Risks and benefits discussed,  Surgical consent,  Pre-op evaluation,  At surgeon's request and post-op pain management  Laterality: Right  Prep: chloraprep       Needles:  Injection technique: Single-shot  Needle Type: Echogenic Stimulator Needle     Needle Length: 5cm  Needle Gauge: 22     Additional Needles:   Narrative:  Start time: 12/16/2021 6:48 AM End time: 12/16/2021 6:58 AM Injection made incrementally with aspirations every 5 mL.  Performed by: Personally  Anesthesiologist: Heather Roberts, MD  Additional Notes: Functioning IV was confirmed and monitors applied.  A 25mm 22ga echogenic arrow stimulator was used. Sterile prep and drape,hand hygiene and sterile gloves were used.Ultrasound guidance: relevant anatomy identified, needle position confirmed, local anesthetic spread visualized around nerve(s)., vascular puncture avoided.  Image printed for medical record.  Negative aspiration and negative test dose prior to incremental administration of local anesthetic. The patient tolerated the procedure well.

## 2021-12-16 NOTE — Progress Notes (Signed)
Assisted Dr. Singer with right, ultrasound guided, supraclavicular block. Side rails up, monitors on throughout procedure. See vital signs in flow sheet. Tolerated Procedure well. 

## 2021-12-16 NOTE — Anesthesia Postprocedure Evaluation (Signed)
Anesthesia Post Note  Patient: [name]  Procedure(s) Performed: RIGHT WRIST SCOPE AND DEBRIDEMENT (Right: Wrist)     Patient location during evaluation: PACU Anesthesia Type: MAC and Regional Level of consciousness: awake and alert Pain management: pain level controlled Vital Signs Assessment: post-procedure vital signs reviewed and stable Respiratory status: spontaneous breathing and respiratory function stable Cardiovascular status: stable Postop Assessment: no apparent nausea or vomiting Anesthetic complications: no   No notable events documented.  Last Vitals:  Vitals:   12/16/21 0930 12/16/21 0951  BP: 114/76 120/65  Pulse: 70 68  Resp: 14 16  Temp:  (!) 36.4 C  SpO2: 94% 94%    Last Pain:  Vitals:   12/16/21 0938  TempSrc:   PainSc: 0-No pain                 Kamali Nephew DANIEL

## 2021-12-16 NOTE — Interval H&P Note (Signed)
History and Physical Interval Note:  12/16/2021 7:37 AM  [name]  has presented today for surgery, with the diagnosis of RIGHT WRIST LIGAMENT TEAR.  The various methods of treatment have been discussed with the patient and family. After consideration of risks, benefits and other options for treatment, the patient has consented to  Procedure(s) with comments: RIGHT WRIST SCOPE AND DEBRIDEMENT (Right) - LENGTH OF SURGERY: 90 MINUTES PRE-OP REGIONAL BLOCK as a surgical intervention.  The patient's history has been reviewed, patient examined, no change in status, stable for surgery.  I have reviewed the patient's chart and labs.  Questions were answered to the patient's satisfaction.     Jodi Marble

## 2021-12-16 NOTE — Discharge Instructions (Addendum)
Discharge Instructions   You have a dressing with a plaster splint incorporated in it. Move your fingers as much as possible, making a full fist and fully opening the fist. Elevate your hand to reduce pain & swelling of the digits.  Ice over the operative site may be helpful to reduce pain & swelling.  DO NOT USE HEAT. The sling is only for comfort for after the block. You may take the sling off. Pain medicine has been prescribed for you.  Take Tylenol 650 mg and Ibuprofen 600 mg every 6 hours together for pain management. Take Oxycodone additionally for severe pain management. Leave the dressing in place until you return to our office.  You may shower, but keep the bandage clean & dry.  You may drive a car when you are off of prescription pain medications and can safely control your vehicle with both hands. Please call our office and schedule an appointment for 10-15 days from the date of surgery.   Please call [phone] during normal business hours or [phone] after hours for any problems. Including the following:  - excessive redness of the incisions - drainage for more than 4 days - fever of more than 101.5 F  *Please note that pain medications will not be refilled after hours or on weekends.   Work Status: No work with the right hand until first post operative appointment.    Post Anesthesia Home Care Instructions  Activity: Get plenty of rest for the remainder of the day. A responsible individual must stay with you for 24 hours following the procedure.  For the next 24 hours, DO NOT: -Drive a car -Advertising copywriter -Drink alcoholic beverages -Take any medication unless instructed by your physician -Make any legal decisions or sign important papers.  Meals: Start with liquid foods such as gelatin or soup. Progress to regular foods as tolerated. Avoid greasy, spicy, heavy foods. If nausea and/or vomiting occur, drink only clear liquids until the nausea and/or vomiting  subsides. Call your physician if vomiting continues.  Special Instructions/Symptoms: Your throat may feel dry or sore from the anesthesia or the breathing tube placed in your throat during surgery. If this causes discomfort, gargle with warm salt water. The discomfort should disappear within 24 hours.  If you had a scopolamine patch placed behind your ear for the management of post- operative nausea and/or vomiting:  1. The medication in the patch is effective for 72 hours, after which it should be removed.  Wrap patch in a tissue and discard in the trash. Wash hands thoroughly with soap and water. 2. You may remove the patch earlier than 72 hours if you experience unpleasant side effects which may include dry mouth, dizziness or visual disturbances. 3. Avoid touching the patch. Wash your hands with soap and water after contact with the patch.    Regional Anesthesia Blocks  1. Numbness or the inability to move the "blocked" extremity may last from 3-48 hours after placement. The length of time depends on the medication injected and your individual response to the medication. If the numbness is not going away after 48 hours, call your surgeon.  2. The extremity that is blocked will need to be protected until the numbness is gone and the  Strength has returned. Because you cannot feel it, you will need to take extra care to avoid injury. Because it may be weak, you may have difficulty moving it or using it. You may not know what position it is in without looking  at it while the block is in effect.  3. For blocks in the legs and feet, returning to weight bearing and walking needs to be done carefully. You will need to wait until the numbness is entirely gone and the strength has returned. You should be able to move your leg and foot normally before you try and bear weight or walk. You will need someone to be with you when you first try to ensure you do not fall and possibly risk injury.  4. Bruising  and tenderness at the needle site are common side effects and will resolve in a few days.  5. Persistent numbness or new problems with movement should be communicated to the surgeon or the [facility] [phone] [facility] [phone]).   No Tylenol or Motrin related products until after 12:30 today.

## 2021-12-17 ENCOUNTER — Encounter (HOSPITAL_BASED_OUTPATIENT_CLINIC_OR_DEPARTMENT_OTHER): Payer: Self-pay | Admitting: Orthopedic Surgery

## 2021-12-18 NOTE — Addendum Note (Signed)
Addendum  created 12/18/21 1014 by Burna Cash, CRNA   Charge Capture section accepted

## 2021-12-19 NOTE — Addendum Note (Signed)
Addendum  created 12/19/21 1443 by Burna Cash, CRNA   Charge Capture section accepted

## 2022-02-12 DIAGNOSIS — M79601 Pain in right arm: Secondary | ICD-10-CM | POA: Diagnosis not present

## 2022-02-12 DIAGNOSIS — M542 Cervicalgia: Secondary | ICD-10-CM | POA: Diagnosis not present

## 2022-02-18 ENCOUNTER — Other Ambulatory Visit: Payer: Self-pay | Admitting: Orthopedic Surgery

## 2022-02-18 DIAGNOSIS — M5412 Radiculopathy, cervical region: Secondary | ICD-10-CM

## 2022-03-08 ENCOUNTER — Ambulatory Visit
Admission: RE | Admit: 2022-03-08 | Discharge: 2022-03-08 | Disposition: A | Discharge status: Home / Self Care | Payer: Self-pay | Source: Ambulatory Visit | Attending: Orthopedic Surgery | Admitting: Orthopedic Surgery

## 2022-03-08 ENCOUNTER — Other Ambulatory Visit: Payer: Self-pay

## 2022-03-08 DIAGNOSIS — M5412 Radiculopathy, cervical region: Secondary | ICD-10-CM

## 2022-03-08 DIAGNOSIS — R200 Anesthesia of skin: Secondary | ICD-10-CM | POA: Diagnosis not present

## 2022-03-08 DIAGNOSIS — M4802 Spinal stenosis, cervical region: Secondary | ICD-10-CM | POA: Diagnosis not present

## 2022-03-08 DIAGNOSIS — M542 Cervicalgia: Secondary | ICD-10-CM | POA: Diagnosis not present

## 2022-03-24 DIAGNOSIS — M542 Cervicalgia: Secondary | ICD-10-CM | POA: Diagnosis not present

## 2022-03-31 DIAGNOSIS — R200 Anesthesia of skin: Secondary | ICD-10-CM | POA: Diagnosis not present

## 2022-04-09 DIAGNOSIS — R200 Anesthesia of skin: Secondary | ICD-10-CM | POA: Diagnosis not present

## 2022-06-03 DIAGNOSIS — M25511 Pain in right shoulder: Secondary | ICD-10-CM | POA: Diagnosis not present

## 2022-06-03 DIAGNOSIS — Z6831 Body mass index (BMI) 31.0-31.9, adult: Secondary | ICD-10-CM | POA: Diagnosis not present

## 2022-06-03 DIAGNOSIS — M4722 Other spondylosis with radiculopathy, cervical region: Secondary | ICD-10-CM | POA: Diagnosis not present

## 2022-06-04 DIAGNOSIS — M25561 Pain in right knee: Secondary | ICD-10-CM | POA: Diagnosis not present

## 2022-06-04 DIAGNOSIS — M25511 Pain in right shoulder: Secondary | ICD-10-CM | POA: Diagnosis not present

## 2022-06-27 ENCOUNTER — Other Ambulatory Visit: Payer: Self-pay | Admitting: Orthopaedic Surgery

## 2022-06-27 DIAGNOSIS — M25561 Pain in right knee: Secondary | ICD-10-CM

## 2022-07-11 ENCOUNTER — Ambulatory Visit
Admission: RE | Admit: 2022-07-11 | Discharge: 2022-07-11 | Disposition: A | Discharge status: Home / Self Care | Payer: 59 | Source: Ambulatory Visit | Attending: Orthopaedic Surgery | Admitting: Orthopaedic Surgery

## 2022-07-11 DIAGNOSIS — M25461 Effusion, right knee: Secondary | ICD-10-CM | POA: Diagnosis not present

## 2022-07-11 DIAGNOSIS — M23221 Derangement of posterior horn of medial meniscus due to old tear or injury, right knee: Secondary | ICD-10-CM | POA: Diagnosis not present

## 2022-07-11 DIAGNOSIS — R262 Difficulty in walking, not elsewhere classified: Secondary | ICD-10-CM | POA: Diagnosis not present

## 2022-07-11 DIAGNOSIS — M25561 Pain in right knee: Secondary | ICD-10-CM

## 2022-07-14 ENCOUNTER — Other Ambulatory Visit: Payer: Self-pay | Admitting: Orthopaedic Surgery

## 2022-07-14 DIAGNOSIS — M25511 Pain in right shoulder: Secondary | ICD-10-CM

## 2022-07-26 ENCOUNTER — Ambulatory Visit
Admission: RE | Admit: 2022-07-26 | Discharge: 2022-07-26 | Disposition: A | Discharge status: Home / Self Care | Payer: 59 | Source: Ambulatory Visit | Attending: Orthopaedic Surgery | Admitting: Orthopaedic Surgery

## 2022-07-26 DIAGNOSIS — M25511 Pain in right shoulder: Secondary | ICD-10-CM

## 2022-07-30 DIAGNOSIS — M1712 Unilateral primary osteoarthritis, left knee: Secondary | ICD-10-CM | POA: Diagnosis not present

## 2022-07-30 DIAGNOSIS — M7521 Bicipital tendinitis, right shoulder: Secondary | ICD-10-CM | POA: Diagnosis not present

## 2022-07-31 ENCOUNTER — Other Ambulatory Visit: Payer: Self-pay | Admitting: Orthopaedic Surgery

## 2022-08-26 NOTE — Patient Instructions (Signed)
SURGICAL WAITING ROOM VISITATION Patients having surgery or a procedure may have no more than 2 support people in the waiting area - these visitors may rotate.   Children under the age of 7 must have an adult with them who is not the patient. If the patient needs to stay at the hospital during part of their recovery, the visitor guidelines for inpatient rooms apply. Pre-op nurse will coordinate an appropriate time for 1 support person to accompany patient in pre-op.  This support person may not rotate.    Please refer to the [facility] website for the visitor guidelines for Inpatients (after your surgery is over and you are in a regular room).    Your procedure is scheduled on: 09/09/22   Report to [facility] Main Entrance    Report to admitting at 5:15 AM   Call this number if you have problems the morning of surgery [phone]   Do not eat food :After Midnight.   After Midnight you may have the following liquids until 4:30 AM DAY OF SURGERY  Water Non-Citrus Juices (without pulp, NO RED) Carbonated Beverages Black Coffee (NO MILK/CREAM OR CREAMERS, sugar ok)  Clear Tea (NO MILK/CREAM OR CREAMERS, sugar ok) regular and decaf                             Plain Jell-O (NO RED)                                           Fruit ices (not with fruit pulp, NO RED)                                     Popsicles (NO RED)                                                               Sports drinks like Gatorade (NO RED)    The day of surgery:  Drink ONE (1) Pre-Surgery Clear Ensure at 4:30 AM the morning of surgery. Drink in one sitting. Do not sip.  This drink was given to you during your hospital  pre-op appointment visit. Nothing else to drink after completing the  Pre-Surgery Clear Ensure.          If you have questions, please contact your surgeon's office.   FOLLOW BOWEL PREP AND ANY ADDITIONAL PRE OP INSTRUCTIONS YOU RECEIVED FROM YOUR SURGEON'S OFFICE!!!     Oral  Hygiene is also important to reduce your risk of infection.                                    Remember - BRUSH YOUR TEETH THE MORNING OF SURGERY WITH YOUR REGULAR TOOTHPASTE   Take these medicines the morning of surgery with A SIP OF WATER: Tylenol, Inhalers, Atenolol, Lexapro, Levothyroxine, Pravastatin.  You may not have any metal on your body including hair pins, jewelry, and body piercing             Do not wear make-up, lotions, powders, perfumes, or deodorant  Do not wear nail polish including gel and S&S, artificial/acrylic nails, or any other type of covering on natural nails including finger and toenails. If you have artificial nails, gel coating, etc. that needs to be removed by a nail salon please have this removed prior to surgery or surgery may need to be canceled/ delayed if the surgeon/ anesthesia feels like they are unable to be safely monitored.   Do not shave  48 hours prior to surgery.    Do not bring valuables to the hospital. [facility] IS NOT             RESPONSIBLE   FOR VALUABLES.   Contacts, dentures or bridgework may not be worn into surgery.  DO NOT BRING YOUR HOME MEDICATIONS TO THE HOSPITAL. PHARMACY WILL DISPENSE MEDICATIONS LISTED ON YOUR MEDICATION LIST TO YOU DURING YOUR ADMISSION IN THE HOSPITAL!    Patients discharged on the day of surgery will not be allowed to drive home.  Someone NEEDS to stay with you for the first 24 hours after anesthesia.              Please read over the following fact sheets you were given: IF YOU HAVE QUESTIONS ABOUT YOUR PRE-OP INSTRUCTIONS PLEASE CALL [phone]- [facility] - Preparing for Surgery Before surgery, you can play an important role.  Because skin is not sterile, your skin needs to be as free of germs as possible.  You can reduce the number of germs on your skin by washing with CHG (chlorahexidine gluconate) soap before surgery.  CHG is an antiseptic cleaner which kills  germs and bonds with the skin to continue killing germs even after washing. Please DO NOT use if you have an allergy to CHG or antibacterial soaps.  If your skin becomes reddened/irritated stop using the CHG and inform your nurse when you arrive at Short Stay. Do not shave (including legs and underarms) for at least 48 hours prior to the first CHG shower.  You may shave your face/neck.  Please follow these instructions carefully:  1.  Shower with CHG Soap the night before surgery and the  morning of surgery.  2.  If you choose to wash your hair, wash your hair first as usual with your normal  shampoo.  3.  After you shampoo, rinse your hair and body thoroughly to remove the shampoo.                             4.  Use CHG as you would any other liquid soap.  You can apply chg directly to the skin and wash.  Gently with a scrungie or clean washcloth.  5.  Apply the CHG Soap to your body ONLY FROM THE NECK DOWN.   Do   not use on face/ open                           Wound or open sores. Avoid contact with eyes, ears mouth and   genitals (private parts).                       Wash face,  Genitals (  private parts) with your normal soap.             6.  Wash thoroughly, paying special attention to the area where your    surgery  will be performed.  7.  Thoroughly rinse your body with warm water from the neck down.  8.  DO NOT shower/wash with your normal soap after using and rinsing off the CHG Soap.                9.  Pat yourself dry with a clean towel.            10.  Wear clean pajamas.            11.  Place clean sheets on your bed the night of your first shower and do not  sleep with pets. Day of Surgery : Do not apply any lotions/deodorants the morning of surgery.  Please wear clean clothes to the hospital/surgery center.  FAILURE TO FOLLOW THESE INSTRUCTIONS MAY RESULT IN THE CANCELLATION OF YOUR SURGERY  PATIENT SIGNATURE_________________________________  NURSE  SIGNATURE__________________________________  ________________________________________________________________________   [name]  An incentive spirometer is a tool that can help keep your lungs clear and active. This tool measures how well you are filling your lungs with each breath. Taking long deep breaths may help reverse or decrease the chance of developing breathing (pulmonary) problems (especially infection) following: A long period of time when you are unable to move or be active. BEFORE THE PROCEDURE  If the spirometer includes an indicator to show your best effort, your nurse or respiratory therapist will set it to a desired goal. If possible, sit up straight or lean slightly forward. Try not to slouch. Hold the incentive spirometer in an upright position. INSTRUCTIONS FOR USE  Sit on the edge of your bed if possible, or sit up as far as you can in bed or on a chair. Hold the incentive spirometer in an upright position. Breathe out normally. Place the mouthpiece in your mouth and seal your lips tightly around it. Breathe in slowly and as deeply as possible, raising the piston or the ball toward the top of the column. Hold your breath for 3-5 seconds or for as long as possible. Allow the piston or ball to fall to the bottom of the column. Remove the mouthpiece from your mouth and breathe out normally. Rest for a few seconds and repeat Steps 1 through 7 at least 10 times every 1-2 hours when you are awake. Take your time and take a few normal breaths between deep breaths. The spirometer may include an indicator to show your best effort. Use the indicator as a goal to work toward during each repetition. After each set of 10 deep breaths, practice coughing to be sure your lungs are clear. If you have an incision (the cut made at the time of surgery), support your incision when coughing by placing a pillow or rolled up towels firmly against it. Once you are able to get out of  bed, walk around indoors and cough well. You may stop using the incentive spirometer when instructed by your caregiver.  RISKS AND COMPLICATIONS Take your time so you do not get dizzy or light-headed. If you are in pain, you may need to take or ask for pain medication before doing incentive spirometry. It is harder to take a deep breath if you are having pain. AFTER USE Rest and breathe slowly and easily. It can be helpful to keep track of  a log of your progress. Your caregiver can provide you with a simple table to help with this. If you are using the spirometer at home, follow these instructions: Smoot IF:  You are having difficultly using the spirometer. You have trouble using the spirometer as often as instructed. Your pain medication is not giving enough relief while using the spirometer. You develop fever of 100.5 F (38.1 C) or higher. SEEK IMMEDIATE MEDICAL CARE IF:  You cough up bloody sputum that had not been present before. You develop fever of 102 F (38.9 C) or greater. You develop worsening pain at or near the incision site. MAKE SURE YOU:  Understand these instructions. Will watch your condition. Will get help right away if you are not doing well or get worse. Document Released: 04/27/2007 Document Revised: 03/08/2012 Document Reviewed: 06/28/2007 [facility] Patient Information 2014 [place], [place].   ________________________________________________________________________

## 2022-08-26 NOTE — Progress Notes (Signed)
COVID Vaccine Completed:  Date of COVID positive in last 90 days:  PCP - Assunta Found, MD Cardiologist -   Chest x-ray -  EKG - 12/12/21 Epic Stress Test -  ECHO -  Cardiac Cath -  Pacemaker/ICD device last checked: Spinal Cord Stimulator:  Bowel Prep -   Sleep Study -  CPAP -   Fasting Blood Sugar -  Checks Blood Sugar _____ times a day  Blood Thinner Instructions: Aspirin Instructions: Last Dose:  Activity level:  Can go up a flight of stairs and perform activities of daily living without stopping and without symptoms of chest pain or shortness of breath.  Able to exercise without symptoms  Unable to go up a flight of stairs without symptoms of     Anesthesia review:   Patient denies shortness of breath, fever, cough and chest pain at PAT appointment  Patient verbalized understanding of instructions that were given to them at the PAT appointment. Patient was also instructed that they will need to review over the PAT instructions again at home before surgery.

## 2022-08-27 ENCOUNTER — Encounter (HOSPITAL_COMMUNITY)
Admission: RE | Admit: 2022-08-27 | Discharge: 2022-08-27 | Disposition: A | Discharge status: Home / Self Care | Payer: 59 | Source: Ambulatory Visit | Attending: Orthopaedic Surgery | Admitting: Orthopaedic Surgery

## 2022-08-27 ENCOUNTER — Encounter (HOSPITAL_COMMUNITY): Payer: Self-pay

## 2022-08-27 VITALS — BP 159/89 | HR 62 | Temp 98.3°F | Resp 14 | Ht 66.0 in | Wt 195.0 lb

## 2022-08-27 DIAGNOSIS — I2510 Atherosclerotic heart disease of native coronary artery without angina pectoris: Secondary | ICD-10-CM | POA: Insufficient documentation

## 2022-08-27 DIAGNOSIS — Z01812 Encounter for preprocedural laboratory examination: Secondary | ICD-10-CM | POA: Insufficient documentation

## 2022-08-27 HISTORY — DX: Unspecified osteoarthritis, unspecified site: M19.90

## 2022-08-27 HISTORY — DX: Anxiety disorder, unspecified: F41.9

## 2022-08-27 HISTORY — DX: Pure hypercholesterolemia, unspecified: E78.00

## 2022-08-27 HISTORY — DX: Unspecified asthma, uncomplicated: J45.909

## 2022-08-27 LAB — CBC
HCT: 46.7 % — ABNORMAL HIGH (ref 36.0–46.0)
Hemoglobin: 15 g/dL (ref 12.0–15.0)
MCH: 30.4 pg (ref 26.0–34.0)
MCHC: 32.1 g/dL (ref 30.0–36.0)
MCV: 94.5 fL (ref 80.0–100.0)
Platelets: 301 10*3/uL (ref 150–400)
RBC: 4.94 MIL/uL (ref 3.87–5.11)
RDW: 12.7 % (ref 11.5–15.5)
WBC: 6.2 10*3/uL (ref 4.0–10.5)
nRBC: 0 % (ref 0.0–0.2)

## 2022-08-27 LAB — BASIC METABOLIC PANEL
Anion gap: 9 (ref 5–15)
BUN: 12 mg/dL (ref 6–20)
CO2: 26 mmol/L (ref 22–32)
Calcium: 10.1 mg/dL (ref 8.9–10.3)
Chloride: 107 mmol/L (ref 98–111)
Creatinine, Ser: 0.79 mg/dL (ref 0.44–1.00)
GFR, Estimated: >60 mL/min (ref >60)
Glucose, Bld: 151 mg/dL — ABNORMAL HIGH (ref 70–99)
Potassium: 3.8 mmol/L (ref 3.5–5.1)
Sodium: 142 mmol/L (ref 135–145)

## 2022-09-08 NOTE — H&P (Signed)
[name] is an 56 y.o. female.   Chief Complaint: Right knee pain HPI: [name] is here today for follow-up of her knee.  She continues with some terrible pain at the right knee.  Since last visit she has been through an MRI scan is here today to go over the results.  MRI: I reviewed an MRI scan of the right knee performed 07/11/22.  Her ACL graft looks intact and meniscal structures are intact.  She has some breakdown of the patellofemoral cartilage.  Past Medical History:  Diagnosis Date   Anxiety    Arthritis    Asthma    Hypercholesteremia    Hypertension    PONV (postoperative nausea and vomiting)    Thyroid disease     Past Surgical History:  Procedure Laterality Date   APPENDECTOMY     CARPAL TUNNEL RELEASE Right    GALLBLADDER SURGERY     KNEE ARTHROSCOPY Right    x3   NECK SURGERY     ROTATOR CUFF REPAIR Right    06/2021   SPINAL FUSION     THUMB ARTHROSCOPY     02/2021   WISDOM TOOTH EXTRACTION     WRIST ARTHROSCOPY WITH DEBRIDEMENT Right 12/16/2021   Procedure: RIGHT WRIST SCOPE AND DEBRIDEMENT;  Surgeon: Mack Hook, MD;  Location: [facility];  Service: Orthopedics;  Laterality: Right;  LENGTH OF SURGERY: 90 MINUTES PRE-OP REGIONAL BLOCK    Family History  Problem Relation Age of Onset   Myasthenia gravis Father    Hypertension Mother    Social History:  reports that she has never smoked. She has never used smokeless tobacco. She reports current alcohol use. She reports that she does not use drugs.  Allergies:  Allergies  Allergen Reactions   Latex Anaphylaxis    No medications prior to admission.    No results found for this or any previous visit (from the past 48 hour(s)). No results found.  Review of Systems  Musculoskeletal:  Positive for arthralgias.       Right knee  All other systems reviewed and are negative.   There were no vitals taken for this visit. Physical Exam Constitutional:      Appearance: Normal  appearance.  HENT:     Head: Normocephalic and atraumatic.     Nose: Nose normal.     Mouth/Throat:     Pharynx: Oropharynx is clear.  Eyes:     Extraocular Movements: Extraocular movements intact.  Pulmonary:     Effort: Pulmonary effort is normal.  Abdominal:     Palpations: Abdomen is soft.  Musculoskeletal:     Cervical back: Normal range of motion.     Comments: She has motion from 0-130.  There is some medial joint line pain and medial patellar facet pain with some crepitation towards full extension.  I do not feel an effusion.  Ligaments feel stable.    Skin:    General: Skin is warm and dry.  Neurological:     General: No focal deficit present.     Mental Status: She is alert and oriented to person, place, and time.  Psychiatric:        Mood and Affect: Mood normal.        Behavior: Behavior normal.        Thought Content: Thought content normal.        Judgment: Judgment normal.      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  [name] is continued with some bad pain at the knee.  She has an MRI scan that shows some loose flaps of cartilage under her kneecap.  She has failed conservative treatment options including an injection and some therapy.I reviewed the risk of anesthesia, infection, DVT, bleeding related to a right knee arthroscopy.  She is in agreement and would like to proceed.    Ginger Organ Nadalie Laughner, PA-C 09/08/2022, 1:51 PM

## 2022-09-09 ENCOUNTER — Other Ambulatory Visit: Payer: Self-pay

## 2022-09-09 ENCOUNTER — Encounter (HOSPITAL_COMMUNITY): Payer: Self-pay | Admitting: Orthopaedic Surgery

## 2022-09-09 ENCOUNTER — Encounter (HOSPITAL_COMMUNITY)
Admission: RE | Disposition: A | Discharge status: Home / Self Care | Payer: Self-pay | Source: Ambulatory Visit | Attending: Orthopaedic Surgery

## 2022-09-09 ENCOUNTER — Ambulatory Visit (HOSPITAL_COMMUNITY): Payer: 59 | Admitting: Anesthesiology

## 2022-09-09 ENCOUNTER — Ambulatory Visit (HOSPITAL_COMMUNITY)
Admission: RE | Admit: 2022-09-09 | Discharge: 2022-09-09 | Disposition: A | Discharge status: Home / Self Care | Payer: 59 | Source: Ambulatory Visit | Attending: Orthopaedic Surgery | Admitting: Orthopaedic Surgery

## 2022-09-09 ENCOUNTER — Ambulatory Visit (HOSPITAL_BASED_OUTPATIENT_CLINIC_OR_DEPARTMENT_OTHER): Payer: 59 | Admitting: Anesthesiology

## 2022-09-09 DIAGNOSIS — I10 Essential (primary) hypertension: Secondary | ICD-10-CM | POA: Diagnosis not present

## 2022-09-09 DIAGNOSIS — M25561 Pain in right knee: Secondary | ICD-10-CM | POA: Diagnosis not present

## 2022-09-09 DIAGNOSIS — E669 Obesity, unspecified: Secondary | ICD-10-CM | POA: Diagnosis not present

## 2022-09-09 DIAGNOSIS — M94261 Chondromalacia, right knee: Secondary | ICD-10-CM | POA: Insufficient documentation

## 2022-09-09 DIAGNOSIS — J45909 Unspecified asthma, uncomplicated: Secondary | ICD-10-CM | POA: Diagnosis not present

## 2022-09-09 DIAGNOSIS — E039 Hypothyroidism, unspecified: Secondary | ICD-10-CM | POA: Diagnosis not present

## 2022-09-09 DIAGNOSIS — Z9889 Other specified postprocedural states: Secondary | ICD-10-CM

## 2022-09-09 DIAGNOSIS — M2241 Chondromalacia patellae, right knee: Secondary | ICD-10-CM | POA: Diagnosis not present

## 2022-09-09 HISTORY — PX: KNEE ARTHROSCOPY: SHX127

## 2022-09-09 SURGERY — ARTHROSCOPY, KNEE
Anesthesia: General | Site: Knee | Laterality: Right

## 2022-09-09 MED ORDER — EPINEPHRINE PF 1 MG/ML IJ SOLN
INTRAMUSCULAR | Status: AC
  Filled 2022-09-09: qty 1

## 2022-09-09 MED ORDER — FENTANYL CITRATE (PF) 100 MCG/2ML IJ SOLN
INTRAMUSCULAR | Status: DC | PRN
  Administered 2022-09-09 (×3): 25 ug via INTRAVENOUS

## 2022-09-09 MED ORDER — MIDAZOLAM HCL 2 MG/2ML IJ SOLN
2.0000 mg | INTRAMUSCULAR | Status: DC
  Filled 2022-09-09: qty 2

## 2022-09-09 MED ORDER — ONDANSETRON HCL 4 MG/2ML IJ SOLN
INTRAMUSCULAR | Status: DC | PRN
  Administered 2022-09-09: 4 mg via INTRAVENOUS

## 2022-09-09 MED ORDER — SODIUM CHLORIDE 0.9 % IR SOLN
Status: DC | PRN
  Administered 2022-09-09: 3000 mL

## 2022-09-09 MED ORDER — PROPOFOL 500 MG/50ML IV EMUL
INTRAVENOUS | Status: DC | PRN
  Administered 2022-09-09: 150 ug/kg/min via INTRAVENOUS

## 2022-09-09 MED ORDER — ACETAMINOPHEN 500 MG PO TABS
1000.0000 mg | ORAL_TABLET | Freq: Once | ORAL | Status: AC
  Administered 2022-09-09: 1000 mg via ORAL
  Filled 2022-09-09: qty 2

## 2022-09-09 MED ORDER — PROMETHAZINE HCL 25 MG/ML IJ SOLN: 6.2500 mg | INTRAMUSCULAR | Status: DC | PRN

## 2022-09-09 MED ORDER — LACTATED RINGERS IV SOLN: INTRAVENOUS | Status: DC

## 2022-09-09 MED ORDER — EPINEPHRINE (ANAPHYLAXIS) 1 MG/ML IJ SOLN
INTRAMUSCULAR | Status: DC | PRN
  Administered 2022-09-09: 1 mL

## 2022-09-09 MED ORDER — OXYCODONE HCL 5 MG/5ML PO SOLN: 5.0000 mg | Freq: Once | ORAL | Status: DC | PRN

## 2022-09-09 MED ORDER — PROPOFOL 10 MG/ML IV BOLUS
INTRAVENOUS | Status: DC | PRN
  Administered 2022-09-09: 20 mg via INTRAVENOUS
  Administered 2022-09-09: 200 mg via INTRAVENOUS

## 2022-09-09 MED ORDER — MIDAZOLAM HCL 2 MG/2ML IJ SOLN
INTRAMUSCULAR | Status: AC
  Filled 2022-09-09: qty 2

## 2022-09-09 MED ORDER — LIDOCAINE 2% (20 MG/ML) 5 ML SYRINGE
INTRAMUSCULAR | Status: DC | PRN
  Administered 2022-09-09: 60 mg via INTRAVENOUS

## 2022-09-09 MED ORDER — LIDOCAINE HCL (PF) 2 % IJ SOLN
INTRAMUSCULAR | Status: AC
  Filled 2022-09-09: qty 5

## 2022-09-09 MED ORDER — HYDROCODONE-ACETAMINOPHEN 5-325 MG PO TABS: 1.0000 | ORAL_TABLET | Freq: Four times a day (QID) | ORAL | 0 refills | Status: DC | PRN

## 2022-09-09 MED ORDER — ORAL CARE MOUTH RINSE: 15.0000 mL | Freq: Once | OROMUCOSAL | Status: AC

## 2022-09-09 MED ORDER — FENTANYL CITRATE PF 50 MCG/ML IJ SOSY: 25.0000 ug | PREFILLED_SYRINGE | INTRAMUSCULAR | Status: DC | PRN

## 2022-09-09 MED ORDER — DEXAMETHASONE SODIUM PHOSPHATE 10 MG/ML IJ SOLN
INTRAMUSCULAR | Status: DC | PRN
  Administered 2022-09-09: 10 mg via INTRAVENOUS

## 2022-09-09 MED ORDER — SCOPOLAMINE 1 MG/3DAYS TD PT72
1.0000 | MEDICATED_PATCH | TRANSDERMAL | Status: DC
  Administered 2022-09-09: 1.5 mg via TRANSDERMAL
  Filled 2022-09-09: qty 1

## 2022-09-09 MED ORDER — BUPIVACAINE-EPINEPHRINE 0.5% -1:200000 IJ SOLN
INTRAMUSCULAR | Status: AC
  Filled 2022-09-09: qty 1

## 2022-09-09 MED ORDER — AMISULPRIDE (ANTIEMETIC) 5 MG/2ML IV SOLN: 10.0000 mg | Freq: Once | INTRAVENOUS | Status: DC | PRN

## 2022-09-09 MED ORDER — MIDAZOLAM HCL 2 MG/2ML IJ SOLN
INTRAMUSCULAR | Status: DC | PRN
  Administered 2022-09-09: 2 mg via INTRAVENOUS

## 2022-09-09 MED ORDER — DEXAMETHASONE SODIUM PHOSPHATE 10 MG/ML IJ SOLN
INTRAMUSCULAR | Status: AC
  Filled 2022-09-09: qty 1

## 2022-09-09 MED ORDER — ONDANSETRON HCL 4 MG/2ML IJ SOLN
INTRAMUSCULAR | Status: AC
  Filled 2022-09-09: qty 2

## 2022-09-09 MED ORDER — PROMETHAZINE HCL 12.5 MG PO TABS: 12.5000 mg | ORAL_TABLET | Freq: Four times a day (QID) | ORAL | 0 refills | Status: DC | PRN

## 2022-09-09 MED ORDER — PROPOFOL 10 MG/ML IV BOLUS
INTRAVENOUS | Status: AC
  Filled 2022-09-09: qty 20

## 2022-09-09 MED ORDER — FENTANYL CITRATE PF 50 MCG/ML IJ SOSY
100.0000 ug | PREFILLED_SYRINGE | INTRAMUSCULAR | Status: DC
  Filled 2022-09-09: qty 2

## 2022-09-09 MED ORDER — CEFAZOLIN SODIUM-DEXTROSE 2-4 GM/100ML-% IV SOLN
2.0000 g | INTRAVENOUS | Status: AC
  Administered 2022-09-09: 2 g via INTRAVENOUS
  Filled 2022-09-09: qty 100

## 2022-09-09 MED ORDER — CHLORHEXIDINE GLUCONATE 0.12 % MT SOLN
15.0000 mL | Freq: Once | OROMUCOSAL | Status: AC
  Administered 2022-09-09: 15 mL via OROMUCOSAL

## 2022-09-09 MED ORDER — BUPIVACAINE-EPINEPHRINE 0.5% -1:200000 IJ SOLN
INTRAMUSCULAR | Status: DC | PRN
  Administered 2022-09-09: 20 mL

## 2022-09-09 MED ORDER — KETOROLAC TROMETHAMINE 30 MG/ML IJ SOLN: 30.0000 mg | Freq: Once | INTRAMUSCULAR | Status: DC | PRN

## 2022-09-09 MED ORDER — OXYCODONE HCL 5 MG PO TABS: 5.0000 mg | ORAL_TABLET | Freq: Once | ORAL | Status: DC | PRN

## 2022-09-09 MED ORDER — FENTANYL CITRATE (PF) 100 MCG/2ML IJ SOLN
INTRAMUSCULAR | Status: AC
  Filled 2022-09-09: qty 2

## 2022-09-09 MED ORDER — PROPOFOL 1000 MG/100ML IV EMUL
INTRAVENOUS | Status: AC
  Filled 2022-09-09: qty 100

## 2022-09-09 SURGICAL SUPPLY — 34 items
BAG COUNTER SPONGE SURGICOUNT (BAG) ×1 IMPLANT
BLADE EXCALIBUR 4.0X13 (MISCELLANEOUS) IMPLANT
BNDG ELASTIC 6X5.8 VLCR STR LF (GAUZE/BANDAGES/DRESSINGS) ×1 IMPLANT
BNDG GAUZE DERMACEA FLUFF 4 (GAUZE/BANDAGES/DRESSINGS) ×1 IMPLANT
BNDG GAUZE ROLL STR 2.25X3YD (GAUZE/BANDAGES/DRESSINGS) IMPLANT
BONE TUNNEL PLUG CANNULATED (MISCELLANEOUS) ×1 IMPLANT
BURR OVAL 8 FLU 4.0X13 (MISCELLANEOUS) ×1 IMPLANT
COVER SURGICAL LIGHT HANDLE (MISCELLANEOUS) IMPLANT
DISSECTOR 3.5MM X 13CM (MISCELLANEOUS) ×1 IMPLANT
DRAPE ARTHROSCOPY W/POUCH 114 (DRAPES) ×1 IMPLANT
DRAPE SHEET LG 3/4 BI-LAMINATE (DRAPES) ×1 IMPLANT
DRAPE U-SHAPE 47X51 STRL (DRAPES) ×1 IMPLANT
DRSG EMULSION OIL 3X3 NADH (GAUZE/BANDAGES/DRESSINGS) ×1 IMPLANT
DURAPREP 26ML APPLICATOR (WOUND CARE) ×2 IMPLANT
GAUZE 4X4 16PLY ~~LOC~~+RFID DBL (SPONGE) ×1 IMPLANT
GAUZE PAD ABD 7.5X8 STRL (GAUZE/BANDAGES/DRESSINGS) IMPLANT
GAUZE PAD ABD 8X10 STRL (GAUZE/BANDAGES/DRESSINGS) ×1 IMPLANT
GAUZE SPONGE 4X4 12PLY STRL (GAUZE/BANDAGES/DRESSINGS) ×1 IMPLANT
GLOVE BIO SURGEON STRL SZ8 (GLOVE) ×2 IMPLANT
GLOVE BIOGEL PI IND STRL 8 (GLOVE) ×2 IMPLANT
GOWN STRL REUS W/ TWL XL LVL3 (GOWN DISPOSABLE) ×2 IMPLANT
GOWN STRL REUS W/TWL XL LVL3 (GOWN DISPOSABLE) ×2
KIT BASIN OR (CUSTOM PROCEDURE TRAY) ×1 IMPLANT
MANIFOLD NEPTUNE II (INSTRUMENTS) ×1 IMPLANT
NDL SPNL 18GX3.5 QUINCKE PK (NEEDLE) IMPLANT
NEEDLE HYPO 22GX1.5 SAFETY (NEEDLE) ×1 IMPLANT
NEEDLE SPNL 18GX3.5 QUINCKE PK (NEEDLE) IMPLANT
PACK ARTHROSCOPY WL (CUSTOM PROCEDURE TRAY) ×1 IMPLANT
PAD ARMBOARD 7.5X6 YLW CONV (MISCELLANEOUS) ×2 IMPLANT
PENCIL SMOKE EVACUATOR (MISCELLANEOUS) IMPLANT
SYR CONTROL 10ML LL (SYRINGE) ×1 IMPLANT
TOWEL OR 17X26 10 PK STRL BLUE (TOWEL DISPOSABLE) ×1 IMPLANT
TUBING ARTHROSCOPY IRRIG 16FT (MISCELLANEOUS) ×1 IMPLANT
WATER STERILE IRR 1000ML POUR (IV SOLUTION) ×1 IMPLANT

## 2022-09-09 NOTE — Transfer of Care (Signed)
Immediate Anesthesia Transfer of Care Note  Patient: Garnet Koyanagi  Procedure(s) Performed: RIGHT KNEE ARTHROSCOPY, chondroplasty (Right: Knee)  Patient Location: PACU  Anesthesia Type:General  Level of Consciousness: awake  Airway & Oxygen Therapy: Patient Spontanous Breathing and Patient connected to face mask oxygen  Post-op Assessment: Report given to RN and Post -op Vital signs reviewed and stable  Post vital signs: Reviewed and stable  Last Vitals:  Vitals Value Taken Time  BP 101/75 09/09/22 1555  Temp 36.2 C 09/09/22 1555  Pulse 65 09/09/22 1555  Resp 13 09/09/22 1555  SpO2 90 % 09/09/22 1555  Vitals shown include unvalidated device data.  Last Pain:  Vitals:   09/09/22 1238  TempSrc: Oral         Complications: No notable events documented.

## 2022-09-09 NOTE — Anesthesia Preprocedure Evaluation (Addendum)
Anesthesia Evaluation  Patient identified by MRN, date of birth, ID band Patient awake    Reviewed: Allergy & Precautions, NPO status , Patient's Chart, lab work & pertinent test results  History of Anesthesia Complications (+) PONV and history of anesthetic complications  Airway Mallampati: II  TM Distance: >3 FB Neck ROM: Full    Dental no notable dental hx.    Pulmonary asthma ,    Pulmonary exam normal        Cardiovascular hypertension, Pt. on home beta blockers Normal cardiovascular exam     Neuro/Psych Anxiety negative neurological ROS     GI/Hepatic negative GI ROS, Neg liver ROS,   Endo/Other  Hypothyroidism   Renal/GU negative Renal ROS     Musculoskeletal  (+) Arthritis ,   Abdominal (+) + obese,   Peds  Hematology negative hematology ROS (+)   Anesthesia Other Findings RIGHT KNEE CHONDROMALACIA  Reproductive/Obstetrics                            Anesthesia Physical Anesthesia Plan  ASA: 2  Anesthesia Plan: General   Post-op Pain Management:    Induction: Intravenous  PONV Risk Score and Plan: 4 or greater and Ondansetron, Dexamethasone, Propofol infusion, Midazolam, Scopolamine patch - Pre-op and Treatment may vary due to age or medical condition  Airway Management Planned: LMA  Additional Equipment:   Intra-op Plan:   Post-operative Plan: Extubation in OR  Informed Consent: I have reviewed the patients History and Physical, chart, labs and discussed the procedure including the risks, benefits and alternatives for the proposed anesthesia with the patient or authorized representative who has indicated his/her understanding and acceptance.     Dental advisory given  Plan Discussed with: CRNA  Anesthesia Plan Comments: (Potential post op regional anesthesia discussed)      Anesthesia Quick Evaluation

## 2022-09-09 NOTE — Anesthesia Procedure Notes (Signed)
Procedure Name: LMA Insertion Date/Time: 09/09/2022 3:15 PM  Performed by: Florene Route, CRNAPatient Re-evaluated:Patient Re-evaluated prior to induction Oxygen Delivery Method: Circle system utilized Preoxygenation: Pre-oxygenation with 100% oxygen Induction Type: IV induction LMA: LMA inserted LMA Size: 4.0 Number of attempts: 1 Placement Confirmation: positive ETCO2 and breath sounds checked- equal and bilateral Tube secured with: Tape Dental Injury: Teeth and Oropharynx as per pre-operative assessment

## 2022-09-09 NOTE — Anesthesia Postprocedure Evaluation (Signed)
Anesthesia Post Note  Patient: [name]  Procedure(s) Performed: RIGHT KNEE ARTHROSCOPY, chondroplasty (Right: Knee)     Patient location during evaluation: PACU Anesthesia Type: General Level of consciousness: awake and alert Pain management: pain level controlled Vital Signs Assessment: post-procedure vital signs reviewed and stable Respiratory status: spontaneous breathing, nonlabored ventilation and respiratory function stable Cardiovascular status: blood pressure returned to baseline Postop Assessment: no apparent nausea or vomiting Anesthetic complications: no   No notable events documented.  Last Vitals:  Vitals:   09/09/22 1625 09/09/22 1700  BP: 108/68 110/68  Pulse: (!) 54 (!) 51  Resp: 12 14  Temp: (!) 36.1 C   SpO2: 96% 97%    Last Pain:  Vitals:   09/09/22 1700  TempSrc:   PainSc: 0-No pain                 Shanda Howells

## 2022-09-09 NOTE — Brief Op Note (Signed)
[name] [id number] 09/09/2022   PRE-OP DIAGNOSIS: right knee CP  POST-OP DIAGNOSIS: same  PROCEDURE: right knee scope  ANESTHESIA: general  [name]   Dictation #:  [id number]

## 2022-09-09 NOTE — Interval H&P Note (Signed)
History and Physical Interval Note:  09/09/2022 2:12 PM  [name]  has presented today for surgery, with the diagnosis of RIGHT KNEE CHONDROMALACIA.  The various methods of treatment have been discussed with the patient and family. After consideration of risks, benefits and other options for treatment, the patient has consented to  Procedure(s): RIGHT KNEE ARTHROSCOPY (Right) as a surgical intervention.  The patient's history has been reviewed, patient examined, no change in status, stable for surgery.  I have reviewed the patient's chart and labs.  Questions were answered to the patient's satisfaction.     [name]

## 2022-09-09 NOTE — Op Note (Signed)
NAMEJAMERIAH, [name] MEDICAL RECORD NO: [id number] ACCOUNT NO: [id number] DATE OF BIRTH: 09-22-1966 FACILITY: [facility] LOCATION: [facility] PHYSICIAN: Lubertha Basque. Jerl Santos, MD  Operative Report   DATE OF PROCEDURE: 09/09/2022  PREOPERATIVE DIAGNOSIS:  Right knee chondromalacia.  POSTOPERATIVE DIAGNOSIS:  Right knee chondromalacia.  PROCEDURE:  Right knee abrasion arthroplasty.  ANESTHESIA:  General.  ATTENDING SURGEON:  Lubertha Basque. Jerl Santos, MD.  ASSISTANT:  Elodia Florence, PA.  INDICATIONS FOR PROCEDURE:  The patient is a 56 year old woman with a long history of right knee difficulty.  She had an ACL reconstruction many years ago and a knee arthroscopy 7 or 8 years ago.  She has had recurrent pain, which has been unresponsive  to various injectables and therapy.  She is offered a repeat arthroscopy.  Informed operative consent was obtained after discussion of possible complications including, reaction to anesthesia and infection.  SUMMARY OF FINDINGS OF PROCEDURE:  Under general anesthesia through 2 old portals a right knee arthroscopy was performed.  She had some grade III and IV change in the medial femoral condyle and patellofemoral with large flaps of articular cartilage.  I  smoothed these off as best I could and performed abrasion arthroplasty, medial and patellofemoral.  Meniscal structures were intact.  ACL reconstruction looked intact as well.  She was scheduled to go home same day.  DESCRIPTION OF PROCEDURE:  The patient was taken to the operating suite where general anesthetic was applied without difficulty.  She was positioned supine and prepped and draped in normal sterile fashion.  After the administration of preoperative IV  Kefzol and appropriate time-out, an arthroscopy of the right knee was performed through a total of 2 portals.  Findings were as noted above procedure consisted of a chondroplasty of the loose flaps of articular cartilage followed by abrasion arthroplasty  to  bleeding bone in several areas.  The knee was thoroughly irrigated, followed by removal of arthroscopic equipment.  Adaptic was placed over the portals followed by dry gauze and a loose Ace wrap.  Estimated blood loss and intraoperative fluids can be  obtained from anesthesia records.  No tourniquet was used.  DISPOSITION:  The patient was extubated in the operating room and taken to recovery room in stable condition.  Plans were for her to go home same day and follow up in the office in less than a week.  I will contact her by phone tonight.   PUS D: 09/09/2022 3:43:58 pm T: 09/09/2022 4:01:00 pm  JOB: [id number]/ [id number]

## 2022-09-10 ENCOUNTER — Encounter (HOSPITAL_COMMUNITY): Payer: Self-pay | Admitting: Orthopaedic Surgery

## 2022-10-10 DIAGNOSIS — M25511 Pain in right shoulder: Secondary | ICD-10-CM | POA: Diagnosis not present

## 2022-10-13 ENCOUNTER — Other Ambulatory Visit: Payer: Self-pay | Admitting: Orthopaedic Surgery

## 2022-10-20 NOTE — Patient Instructions (Signed)
DUE TO COVID-19 ONLY TWO VISITORS  (aged 55 and older)  ARE ALLOWED TO COME WITH YOU AND STAY IN THE WAITING ROOM ONLY DURING PRE OP AND PROCEDURE.   **NO VISITORS ARE ALLOWED IN THE SHORT STAY AREA OR RECOVERY ROOM!!**  IF YOU WILL BE ADMITTED INTO THE HOSPITAL YOU ARE ALLOWED ONLY FOUR SUPPORT PEOPLE DURING VISITATION HOURS ONLY (7 AM -8PM)   The support person(s) must pass our screening, gel in and out, and wear a mask at all times, including in the patient's room. Patients must also wear a mask when staff or their support person are in the room. Visitors GUEST BADGE MUST BE WORN VISIBLY  One adult visitor may remain with you overnight and MUST be in the room by 8 P.M.     Your procedure is scheduled on: 10/28/22   [facility]    Report to admitting at  8:40 AM   Call this number if you have problems the morning of surgery [phone]   Do not eat food :After Midnight.   After Midnight you may have the following liquids until _7:30_____ AM/  DAY OF SURGERY  Water Black Coffee (sugar ok, NO MILK/CREAM OR CREAMERS)  Tea (sugar ok, NO MILK/CREAM OR CREAMERS) regular and decaf                             Plain Jell-O (NO RED)                                           Fruit ices (not with fruit pulp, NO RED)                                     Popsicles (NO RED)                                                                  Juice: apple, WHITE grape, WHITE cranberry Sports drinks like Gatorade (NO RED)                   The day of surgery:  Drink ONE (1) Pre-Surgery Clear Ensure  at 7:15 AM the morning of surgery. Drink in one sitting. Do not sip.  This drink was given to you during your hospital  pre-op appointment visit. Nothing else to drink after completing the  Pre-Surgery Clear Ensure at 7:30 AM          If you have questions, please contact your surgeon's office.    Oral Hygiene is also important to reduce your risk of infection.                                     Remember - BRUSH YOUR TEETH THE MORNING OF SURGERY WITH YOUR REGULAR TOOTHPASTE   Do NOT smoke after Midnight   Take these medicines the morning of surgery with A SIP OF WATER: Use your inhaler and bring it  with you                                                                                                                            Escitalapram- Lexapro                                                                                                                            Pravastatin-Pravacol                                                                                                                            Atenolol- Tenormin                                                                                                                            Levothyroxine-Synthroid                                You may not have any metal on your body including hair pins, jewelry, and body piercing             Do not wear make-up, lotions, powders, perfumes/cologne, or deodorant  Do not wear nail polish including gel and S&S, artificial/acrylic nails, or any other type of covering on natural nails including finger and toenails. If you have artificial nails, gel coating,  etc. that needs to be removed by a nail salon please have this removed prior to surgery or surgery may need to be canceled/ delayed if the surgeon/ anesthesia feels like they are unable to be safely monitored.   Do not shave  48 hours prior to surgery.     Do not bring valuables to the hospital. [facility] IS NOT             RESPONSIBLE   FOR VALUABLES.   Contacts, dentures or bridgework may not be worn into surgery.   Bring small overnight bag day of surgery.   DO NOT BRING YOUR HOME MEDICATIONS TO THE HOSPITAL.    Patients discharged on the day of surgery will not be allowed to drive home.  Someone NEEDS to stay with you for the first 24 hours after anesthesia.   Special  Instructions: Bring a copy of your healthcare power of attorney and living will documents the day of surgery if you haven't scanned them before.              Please read over the following fact sheets you were given: IF YOU HAVE QUESTIONS ABOUT YOUR PRE-OP INSTRUCTIONS PLEASE CALL [phone]    [place]- Preparing for Total Shoulder Arthroplasty    Before surgery, you can play an important role. Because skin is not sterile, your skin needs to be as free of germs as possible. You can reduce the number of germs on your skin by using the following products. Benzoyl Peroxide Gel Reduces the number of germs present on the skin Applied twice a day to shoulder area starting two days before surgery    ==================================================================  Please follow these instructions carefully:  BENZOYL PEROXIDE 5% GEL  Please do not use if you have an allergy to benzoyl peroxide.   If your skin becomes reddened/irritated stop using the benzoyl peroxide.  Starting two days before surgery, apply as follows: Apply benzoyl peroxide in the morning and at night. Apply after taking a shower. If you are not taking a shower clean entire shoulder front, back, and side along with the armpit with a clean wet washcloth.  Place a quarter-sized dollop on your shoulder and rub in thoroughly, making sure to cover the front, back, and side of your shoulder, along with the armpit.   2 days before ____ AM   ____ PM              1 day before ____ AM   ____ PM                         Do this twice a day for two days.  (Last application is the night before surgery, AFTER using the CHG soap as described below).  Do NOT apply benzoyl peroxide gel on the day of surgery.         - Preparing for Surgery Before surgery, you can play an important role.  Because skin is not sterile, your skin needs to be as free of germs as possible.  You can reduce the number of germs on your skin by  washing with CHG (chlorahexidine gluconate) soap before surgery.  CHG is an antiseptic cleaner which kills germs and bonds with the skin to continue killing germs even after washing. Please DO NOT use if you have an allergy to CHG or antibacterial soaps.  If your skin becomes reddened/irritated stop using the CHG and inform your nurse when you arrive  at Short Stay. Do not shave (including legs and underarms) for at least 48 hours prior to the first CHG shower.  Please follow these instructions carefully:  1.  Shower with CHG Soap the night before surgery and the  morning of Surgery.  2.  If you choose to wash your hair, wash your hair first as usual with your  normal  shampoo.  3.  After you shampoo, rinse your hair and body thoroughly to remove the  shampoo.                            4.  Use CHG as you would any other liquid soap.  You can apply chg directly  to the skin and wash                       Gently with a scrungie or clean washcloth.  5.  Apply the CHG Soap to your body ONLY FROM THE NECK DOWN.   Do not use on face/ open                           Wound or open sores. Avoid contact with eyes, ears mouth and genitals (private parts).                       Wash face,  Genitals (private parts) with your normal soap.             6.  Wash thoroughly, paying special attention to the area where your surgery  will be performed.  7.  Thoroughly rinse your body with warm water from the neck down.  8.  DO NOT shower/wash with your normal soap after using and rinsing off  the CHG Soap.                9.  Pat yourself dry with a clean towel.            10.  Wear clean pajamas.            11.  Place clean sheets on your bed the night of your first shower and do not  sleep with pets. Day of Surgery : Do not apply any lotions/deodorants the morning of surgery.  Please wear clean clothes to the hospital/surgery center.  FAILURE TO FOLLOW THESE INSTRUCTIONS MAY RESULT IN THE CANCELLATION OF YOUR  SURGERY   ________________________________________________________________________   Incentive Spirometer  An incentive spirometer is a tool that can help keep your lungs clear and active. This tool measures how well you are filling your lungs with each breath. Taking long deep breaths may help reverse or decrease the chance of developing breathing (pulmonary) problems (especially infection) following: A long period of time when you are unable to move or be active. BEFORE THE PROCEDURE  If the spirometer includes an indicator to show your best effort, your nurse or respiratory therapist will set it to a desired goal. If possible, sit up straight or lean slightly forward. Try not to slouch. Hold the incentive spirometer in an upright position. INSTRUCTIONS FOR USE  Sit on the edge of your bed if possible, or sit up as far as you can in bed or on a chair. Hold the incentive spirometer in an upright position. Breathe out normally. Place the mouthpiece in your mouth and seal your lips tightly around it. Breathe in slowly and  as deeply as possible, raising the piston or the ball toward the top of the column. Hold your breath for 3-5 seconds or for as long as possible. Allow the piston or ball to fall to the bottom of the column. Remove the mouthpiece from your mouth and breathe out normally. Rest for a few seconds and repeat Steps 1 through 7 at least 10 times every 1-2 hours when you are awake. Take your time and take a few normal breaths between deep breaths. The spirometer may include an indicator to show your best effort. Use the indicator as a goal to work toward during each repetition. After each set of 10 deep breaths, practice coughing to be sure your lungs are clear. If you have an incision (the cut made at the time of surgery), support your incision when coughing by placing a pillow or rolled up towels firmly against it. Once you are able to get out of bed, walk around indoors and cough  well. You may stop using the incentive spirometer when instructed by your caregiver.  RISKS AND COMPLICATIONS Take your time so you do not get dizzy or light-headed. If you are in pain, you may need to take or ask for pain medication before doing incentive spirometry. It is harder to take a deep breath if you are having pain. AFTER USE Rest and breathe slowly and easily. It can be helpful to keep track of a log of your progress. Your caregiver can provide you with a simple table to help with this. If you are using the spirometer at home, follow these instructions: [place] IF:  You are having difficultly using the spirometer. You have trouble using the spirometer as often as instructed. Your pain medication is not giving enough relief while using the spirometer. You develop fever of 100.5 F (38.1 C) or higher. SEEK IMMEDIATE MEDICAL CARE IF:  You cough up bloody sputum that had not been present before. You develop fever of 102 F (38.9 C) or greater. You develop worsening pain at or near the incision site. MAKE SURE YOU:  Understand these instructions. Will watch your condition. Will get help right away if you are not doing well or get worse. Document Released: 04/27/2007 Document Revised: 03/08/2012 Document Reviewed: 06/28/2007 [facility] Patient Information 2014 [place], [place].   ________________________________________________________________________

## 2022-10-21 ENCOUNTER — Encounter (HOSPITAL_COMMUNITY)
Admission: RE | Admit: 2022-10-21 | Discharge: 2022-10-21 | Disposition: A | Discharge status: Home / Self Care | Payer: 59 | Source: Ambulatory Visit | Attending: Orthopaedic Surgery | Admitting: Orthopaedic Surgery

## 2022-10-21 ENCOUNTER — Encounter (HOSPITAL_COMMUNITY): Payer: Self-pay

## 2022-10-21 ENCOUNTER — Other Ambulatory Visit: Payer: Self-pay

## 2022-10-21 DIAGNOSIS — Z01812 Encounter for preprocedural laboratory examination: Secondary | ICD-10-CM | POA: Insufficient documentation

## 2022-10-21 DIAGNOSIS — I10 Essential (primary) hypertension: Secondary | ICD-10-CM | POA: Diagnosis not present

## 2022-10-21 HISTORY — DX: Hypothyroidism, unspecified: E03.9

## 2022-10-21 LAB — CBC
HCT: 45.4 % (ref 36.0–46.0)
Hemoglobin: 14.9 g/dL (ref 12.0–15.0)
MCH: 30.6 pg (ref 26.0–34.0)
MCHC: 32.8 g/dL (ref 30.0–36.0)
MCV: 93.2 fL (ref 80.0–100.0)
Platelets: 265 10*3/uL (ref 150–400)
RBC: 4.87 MIL/uL (ref 3.87–5.11)
RDW: 12.1 % (ref 11.5–15.5)
WBC: 5.9 10*3/uL (ref 4.0–10.5)
nRBC: 0 % (ref 0.0–0.2)

## 2022-10-21 LAB — BASIC METABOLIC PANEL
Anion gap: 9 (ref 5–15)
BUN: 12 mg/dL (ref 6–20)
CO2: 27 mmol/L (ref 22–32)
Calcium: 9.6 mg/dL (ref 8.9–10.3)
Chloride: 103 mmol/L (ref 98–111)
Creatinine, Ser: 0.71 mg/dL (ref 0.44–1.00)
GFR, Estimated: >60 mL/min (ref >60)
Glucose, Bld: 169 mg/dL — ABNORMAL HIGH (ref 70–99)
Potassium: 3.7 mmol/L (ref 3.5–5.1)
Sodium: 139 mmol/L (ref 135–145)

## 2022-10-21 NOTE — H&P (View-Only) (Signed)
Anesthesia note:  Bowel prep reminder:NA  PCP - Dr. Eddie Candle Cardiologist -none Other-   Chest x-ray - no EKG - 12/06/21-epic Stress Test - no ECHO - no Cardiac Cath - no  Pacemaker/ICD device last checked:NA  Sleep Study - no CPAP -   Pt is pre diabetic-NA Fasting Blood Sugar -  Checks Blood Sugar _____  Blood Thinner:NA Blood Thinner Instructions: Aspirin Instructions: Last Dose:  Anesthesia review: no  Patient denies shortness of breath, fever, cough and chest pain at PAT appointment Pt uses inhaler 1-2 times a week in the fall and spring. She has no SOB with activities.   Patient verbalized understanding of instructions that were given to them at the PAT appointment. Patient was also instructed that they will need to review over the PAT instructions again at home before surgery. yes

## 2022-10-21 NOTE — Progress Notes (Signed)
Anesthesia note:  Bowel prep reminder:NA  PCP - Dr. J. Golding Cardiologist -none Other-   Chest x-ray - no EKG - 12/06/21-epic Stress Test - no ECHO - no Cardiac Cath - no  Pacemaker/ICD device last checked:NA  Sleep Study - no CPAP -   Pt is pre diabetic-NA Fasting Blood Sugar -  Checks Blood Sugar _____  Blood Thinner:NA Blood Thinner Instructions: Aspirin Instructions: Last Dose:  Anesthesia review: no  Patient denies shortness of breath, fever, cough and chest pain at PAT appointment Pt uses inhaler 1-2 times a week in the fall and spring. She has no SOB with activities.   Patient verbalized understanding of instructions that were given to them at the PAT appointment. Patient was also instructed that they will need to review over the PAT instructions again at home before surgery. yes 

## 2022-10-27 NOTE — H&P (Signed)
[name] is an 56 y.o. female.   Chief Complaint: right shoulder pain HPI: [name] is here today for follow-up of her right shoulder.  She has pain reaching up and across her chest.  Most of the pain is at her [facility] joint and down the anterior aspect of the shoulder.  We did perform an arthroscopy on the shoulder about a year and a half back.  We debrided a partial-thickness cuff tear and some biceps degeneration and also performed an acromioplasty.  Dr. Rip Harbour performed an ultrasound guided biceps tendon injection earlier this year which did help.   MRI:  I reviewed an MRI scan films and report of a study done at [facility] on 07/26/22.  This right shoulder study reveals some tendinosis of the cuff but no significant partial or full-thickness tear.  There is moderate degeneration of the biceps.    Past Medical History:  Diagnosis Date   Anxiety    Arthritis    Asthma    seasonal   Hypercholesteremia    Hypertension    Hypothyroidism    PONV (postoperative nausea and vomiting)    Thyroid disease     Past Surgical History:  Procedure Laterality Date   APPENDECTOMY     age 27   CARPAL TUNNEL RELEASE Right 2015   GALLBLADDER SURGERY  2006   KNEE ARTHROSCOPY Right    x3   KNEE ARTHROSCOPY Right 09/09/2022   Procedure: RIGHT KNEE ARTHROSCOPY, chondroplasty;  Surgeon: Melrose Nakayama, MD;  Location: [facility];  Service: Orthopedics;  Laterality: Right;   NECK SURGERY  2018   c-4 disc   ROTATOR CUFF REPAIR Right    06/2021   SPINAL FUSION  2008   L5   THUMB ARTHROSCOPY     02/2021   WISDOM TOOTH EXTRACTION     age 80   WRIST ARTHROSCOPY WITH DEBRIDEMENT Right 12/16/2021   Procedure: RIGHT WRIST SCOPE AND DEBRIDEMENT;  Surgeon: Milly Jakob, MD;  Location: [facility];  Service: Orthopedics;  Laterality: Right;  LENGTH OF SURGERY: 75 MINUTES PRE-OP REGIONAL BLOCK    Family History  Problem Relation Age of Onset   Myasthenia gravis Father     Hypertension Mother    Social History:  reports that she has never smoked. She has never used smokeless tobacco. She reports current alcohol use. She reports that she does not use drugs.  Allergies:  Allergies  Allergen Reactions   Latex Anaphylaxis    No medications prior to admission.    No results found for this or any previous visit (from the past 48 hour(s)). No results found.  Review of Systems  Musculoskeletal:  Positive for arthralgias.       Right shoulder  All other systems reviewed and are negative.   There were no vitals taken for this visit. Physical Exam Constitutional:      Appearance: Normal appearance.  HENT:     Head: Normocephalic and atraumatic.     Nose: Nose normal.     Mouth/Throat:     Pharynx: Oropharynx is clear.  Eyes:     Extraocular Movements: Extraocular movements intact.  Cardiovascular:     Rate and Rhythm: Normal rate.  Pulmonary:     Effort: Pulmonary effort is normal.  Abdominal:     Palpations: Abdomen is soft.  Musculoskeletal:     Cervical back: Normal range of motion.     Comments: Right shoulder motion is nearly full.  She has pain over the [facility]  joint and pain to cross chest maneuvers.  There is also some pain along the biceps tendon.  Cuff strength seems good though painful to resisted external rotation.  Cervical motion is full and there is no palpable lymphadenopathy.  Sensation and motor function are intact in her hands with palpable pulses on both sides.  Skin:    General: Skin is warm and dry.  Neurological:     General: No focal deficit present.     Mental Status: She is alert and oriented to person, place, and time. Mental status is at baseline.  Psychiatric:        Mood and Affect: Mood normal.        Behavior: Behavior normal.        Thought Content: Thought content normal.        Judgment: Judgment normal.      Assessment/Plan Assessment:  Right shoulder biceps pain and AC pain injected 07/30/22 with scope  2022  Plan: I still suspect that most of the trouble [name] is having is related to her biceps tendon and the AC joint.  An MRI scan done this year does show tendinosis and intrasubstance tearing of the supraspinatus.  She says she cannot sleep or use her arm and I have offered her a repeat arthroscopy.  In all likelihood we will perform an [facility] resection, debridement, and biceps tenolysis.  If she has significant tearing of the cuff we might consider takedown and repair.  I reviewed risk of anesthesia, infection, DVT and she would like to set this up for some time before the end of the year.  Ginger Organ Arlin Sass, PA-C 10/27/2022, 5:45 PM

## 2022-10-28 ENCOUNTER — Other Ambulatory Visit: Payer: Self-pay

## 2022-10-28 ENCOUNTER — Encounter (HOSPITAL_COMMUNITY)
Admission: RE | Disposition: A | Discharge status: Home / Self Care | Payer: Self-pay | Source: Home / Self Care | Attending: Orthopaedic Surgery

## 2022-10-28 ENCOUNTER — Ambulatory Visit (HOSPITAL_COMMUNITY): Payer: 59 | Admitting: Certified Registered"

## 2022-10-28 ENCOUNTER — Ambulatory Visit (HOSPITAL_BASED_OUTPATIENT_CLINIC_OR_DEPARTMENT_OTHER): Payer: 59 | Admitting: Certified Registered"

## 2022-10-28 ENCOUNTER — Encounter (HOSPITAL_COMMUNITY): Payer: Self-pay | Admitting: Orthopaedic Surgery

## 2022-10-28 ENCOUNTER — Ambulatory Visit (HOSPITAL_COMMUNITY)
Admission: RE | Admit: 2022-10-28 | Discharge: 2022-10-28 | Disposition: A | Discharge status: Home / Self Care | Payer: 59 | Attending: Orthopaedic Surgery | Admitting: Orthopaedic Surgery

## 2022-10-28 DIAGNOSIS — M75111 Incomplete rotator cuff tear or rupture of right shoulder, not specified as traumatic: Secondary | ICD-10-CM

## 2022-10-28 DIAGNOSIS — J45909 Unspecified asthma, uncomplicated: Secondary | ICD-10-CM | POA: Diagnosis not present

## 2022-10-28 DIAGNOSIS — M19011 Primary osteoarthritis, right shoulder: Secondary | ICD-10-CM

## 2022-10-28 DIAGNOSIS — I10 Essential (primary) hypertension: Secondary | ICD-10-CM | POA: Diagnosis not present

## 2022-10-28 DIAGNOSIS — E039 Hypothyroidism, unspecified: Secondary | ICD-10-CM | POA: Insufficient documentation

## 2022-10-28 DIAGNOSIS — G8918 Other acute postprocedural pain: Secondary | ICD-10-CM | POA: Diagnosis not present

## 2022-10-28 DIAGNOSIS — M67813 Other specified disorders of tendon, right shoulder: Secondary | ICD-10-CM | POA: Diagnosis not present

## 2022-10-28 HISTORY — PX: SHOULDER ARTHROSCOPY WITH ROTATOR CUFF REPAIR: SHX5685

## 2022-10-28 SURGERY — ARTHROSCOPY, SHOULDER, WITH ROTATOR CUFF REPAIR
Anesthesia: General | Site: Shoulder | Laterality: Right

## 2022-10-28 MED ORDER — OXYCODONE HCL 5 MG PO TABS: 5.0000 mg | ORAL_TABLET | Freq: Once | ORAL | Status: DC | PRN

## 2022-10-28 MED ORDER — FENTANYL CITRATE (PF) 100 MCG/2ML IJ SOLN
INTRAMUSCULAR | Status: DC | PRN
  Administered 2022-10-28: 50 ug via INTRAVENOUS

## 2022-10-28 MED ORDER — SUGAMMADEX SODIUM 200 MG/2ML IV SOLN
INTRAVENOUS | Status: DC | PRN
  Administered 2022-10-28: 200 mg via INTRAVENOUS

## 2022-10-28 MED ORDER — PHENYLEPHRINE HCL-NACL 20-0.9 MG/250ML-% IV SOLN
INTRAVENOUS | Status: DC | PRN
  Administered 2022-10-28: 20 ug/min via INTRAVENOUS

## 2022-10-28 MED ORDER — OXYCODONE HCL 5 MG/5ML PO SOLN: 5.0000 mg | Freq: Once | ORAL | Status: DC | PRN

## 2022-10-28 MED ORDER — LACTATED RINGERS IV SOLN: INTRAVENOUS | Status: DC

## 2022-10-28 MED ORDER — ORAL CARE MOUTH RINSE: 15.0000 mL | Freq: Once | OROMUCOSAL | Status: AC

## 2022-10-28 MED ORDER — 0.9 % SODIUM CHLORIDE (POUR BTL) OPTIME
TOPICAL | Status: DC | PRN
  Administered 2022-10-28: 1000 mL

## 2022-10-28 MED ORDER — DEXAMETHASONE SODIUM PHOSPHATE 10 MG/ML IJ SOLN
INTRAMUSCULAR | Status: DC | PRN
  Administered 2022-10-28: 4 mg via INTRAVENOUS

## 2022-10-28 MED ORDER — FENTANYL CITRATE PF 50 MCG/ML IJ SOSY: 25.0000 ug | PREFILLED_SYRINGE | INTRAMUSCULAR | Status: DC | PRN

## 2022-10-28 MED ORDER — ONDANSETRON HCL 4 MG/2ML IJ SOLN
INTRAMUSCULAR | Status: DC | PRN
  Administered 2022-10-28: 4 mg via INTRAVENOUS

## 2022-10-28 MED ORDER — CHLORHEXIDINE GLUCONATE 0.12 % MT SOLN
15.0000 mL | Freq: Once | OROMUCOSAL | Status: AC
  Administered 2022-10-28: 15 mL via OROMUCOSAL

## 2022-10-28 MED ORDER — PROPOFOL 10 MG/ML IV BOLUS
INTRAVENOUS | Status: AC
  Filled 2022-10-28: qty 20

## 2022-10-28 MED ORDER — POVIDONE-IODINE 10 % EX SWAB
2.0000 | Freq: Once | CUTANEOUS | Status: AC
  Administered 2022-10-28: 2 via TOPICAL

## 2022-10-28 MED ORDER — EPHEDRINE SULFATE-NACL 50-0.9 MG/10ML-% IV SOSY
PREFILLED_SYRINGE | INTRAVENOUS | Status: DC | PRN
  Administered 2022-10-28: 5 mg via INTRAVENOUS
  Administered 2022-10-28: 10 mg via INTRAVENOUS
  Administered 2022-10-28 (×2): 5 mg via INTRAVENOUS

## 2022-10-28 MED ORDER — SCOPOLAMINE 1 MG/3DAYS TD PT72: 1.0000 | MEDICATED_PATCH | TRANSDERMAL | Status: DC

## 2022-10-28 MED ORDER — MIDAZOLAM HCL 2 MG/2ML IJ SOLN
2.0000 mg | INTRAMUSCULAR | Status: AC
  Administered 2022-10-28: 2 mg via INTRAVENOUS
  Filled 2022-10-28: qty 2

## 2022-10-28 MED ORDER — SCOPOLAMINE 1 MG/3DAYS TD PT72
MEDICATED_PATCH | TRANSDERMAL | Status: AC
  Administered 2022-10-28: 1.5 mg
  Filled 2022-10-28: qty 1

## 2022-10-28 MED ORDER — FENTANYL CITRATE PF 50 MCG/ML IJ SOSY
100.0000 ug | PREFILLED_SYRINGE | INTRAMUSCULAR | Status: AC
  Administered 2022-10-28: 50 ug via INTRAVENOUS
  Filled 2022-10-28: qty 2

## 2022-10-28 MED ORDER — AMISULPRIDE (ANTIEMETIC) 5 MG/2ML IV SOLN: 10.0000 mg | Freq: Once | INTRAVENOUS | Status: DC | PRN

## 2022-10-28 MED ORDER — HYDROCODONE-ACETAMINOPHEN 5-325 MG PO TABS: 1.0000 | ORAL_TABLET | Freq: Four times a day (QID) | ORAL | 0 refills | Status: AC | PRN

## 2022-10-28 MED ORDER — PROPOFOL 10 MG/ML IV BOLUS
INTRAVENOUS | Status: DC | PRN
  Administered 2022-10-28: 130 mg via INTRAVENOUS

## 2022-10-28 MED ORDER — LIDOCAINE 2% (20 MG/ML) 5 ML SYRINGE
INTRAMUSCULAR | Status: DC | PRN
  Administered 2022-10-28: 40 mg via INTRAVENOUS

## 2022-10-28 MED ORDER — LIDOCAINE HCL (PF) 2 % IJ SOLN
INTRAMUSCULAR | Status: AC
  Filled 2022-10-28: qty 5

## 2022-10-28 MED ORDER — PROMETHAZINE HCL 25 MG/ML IJ SOLN: 6.2500 mg | INTRAMUSCULAR | Status: DC | PRN

## 2022-10-28 MED ORDER — ROCURONIUM BROMIDE 10 MG/ML (PF) SYRINGE
PREFILLED_SYRINGE | INTRAVENOUS | Status: AC
  Filled 2022-10-28: qty 10

## 2022-10-28 MED ORDER — CEFAZOLIN SODIUM-DEXTROSE 2-4 GM/100ML-% IV SOLN
2.0000 g | INTRAVENOUS | Status: AC
  Administered 2022-10-28: 2 g via INTRAVENOUS
  Filled 2022-10-28: qty 100

## 2022-10-28 MED ORDER — ACETAMINOPHEN 325 MG PO TABS: 325.0000 mg | ORAL_TABLET | ORAL | Status: DC | PRN

## 2022-10-28 MED ORDER — FENTANYL CITRATE (PF) 100 MCG/2ML IJ SOLN
INTRAMUSCULAR | Status: AC
  Filled 2022-10-28: qty 2

## 2022-10-28 MED ORDER — ACETAMINOPHEN 160 MG/5ML PO SOLN: 325.0000 mg | ORAL | Status: DC | PRN

## 2022-10-28 MED ORDER — BUPIVACAINE HCL (PF) 0.5 % IJ SOLN
INTRAMUSCULAR | Status: DC | PRN
  Administered 2022-10-28: 12 mL via PERINEURAL

## 2022-10-28 MED ORDER — BUPIVACAINE-EPINEPHRINE (PF) 0.5% -1:200000 IJ SOLN
INTRAMUSCULAR | Status: AC
  Filled 2022-10-28: qty 30

## 2022-10-28 MED ORDER — GLYCOPYRROLATE 0.2 MG/ML IJ SOLN
INTRAMUSCULAR | Status: DC | PRN
  Administered 2022-10-28 (×2): .1 mg via INTRAVENOUS

## 2022-10-28 MED ORDER — BUPIVACAINE LIPOSOME 1.3 % IJ SUSP
INTRAMUSCULAR | Status: DC | PRN
  Administered 2022-10-28: 10 mL via PERINEURAL

## 2022-10-28 MED ORDER — SODIUM CHLORIDE 0.9 % IR SOLN
Status: DC | PRN
  Administered 2022-10-28: 6000 mL

## 2022-10-28 MED ORDER — ONDANSETRON HCL 4 MG/2ML IJ SOLN
INTRAMUSCULAR | Status: AC
  Filled 2022-10-28: qty 2

## 2022-10-28 MED ORDER — DEXAMETHASONE SODIUM PHOSPHATE 10 MG/ML IJ SOLN
INTRAMUSCULAR | Status: AC
  Filled 2022-10-28: qty 1

## 2022-10-28 MED ORDER — ACETAMINOPHEN 10 MG/ML IV SOLN: 1000.0000 mg | Freq: Once | INTRAVENOUS | Status: DC | PRN

## 2022-10-28 MED ORDER — ROCURONIUM BROMIDE 10 MG/ML (PF) SYRINGE
PREFILLED_SYRINGE | INTRAVENOUS | Status: DC | PRN
  Administered 2022-10-28: 50 mg via INTRAVENOUS

## 2022-10-28 SURGICAL SUPPLY — 37 items
BAG COUNTER SPONGE SURGICOUNT (BAG) ×1 IMPLANT
BLADE EXCALIBUR 4.0X13 (MISCELLANEOUS) ×1 IMPLANT
BURR OVAL 8 FLU 4.0X13 (MISCELLANEOUS) IMPLANT
CANISTER SUCT 3000ML PPV (MISCELLANEOUS) IMPLANT
CANNULA SHOULDER 7CM (CANNULA) ×1 IMPLANT
CANNULA TWIST IN 8.25X7CM (CANNULA) IMPLANT
DRAPE 3/4 80X56 (DRAPES) ×1 IMPLANT
DRAPE STERI 35X30 U-POUCH (DRAPES) ×1 IMPLANT
DRAPE U-SHAPE 47X51 STRL (DRAPES) ×1 IMPLANT
DRSG EMULSION OIL 3X3 NADH (GAUZE/BANDAGES/DRESSINGS) ×1 IMPLANT
DURAPREP 26ML APPLICATOR (WOUND CARE) ×1 IMPLANT
ELECT REM PT RETURN 15FT ADLT (MISCELLANEOUS) IMPLANT
GAUZE PAD ABD 8X10 STRL (GAUZE/BANDAGES/DRESSINGS) ×1 IMPLANT
GAUZE SPONGE 4X4 12PLY STRL (GAUZE/BANDAGES/DRESSINGS) ×1 IMPLANT
GLOVE BIO SURGEON STRL SZ8 (GLOVE) ×3 IMPLANT
GLOVE BIOGEL PI IND STRL 8 (GLOVE) ×4 IMPLANT
GOWN STRL REUS W/ TWL XL LVL3 (GOWN DISPOSABLE) ×2 IMPLANT
GOWN STRL REUS W/TWL XL LVL3 (GOWN DISPOSABLE) ×2
KIT BASIN OR (CUSTOM PROCEDURE TRAY) ×1 IMPLANT
KIT TURNOVER KIT A (KITS) IMPLANT
MANIFOLD NEPTUNE II (INSTRUMENTS) IMPLANT
NDL SCORPION MULTI FIRE (NEEDLE) IMPLANT
NEEDLE SCORPION MULTI FIRE (NEEDLE) IMPLANT
NS IRRIG 1000ML POUR BTL (IV SOLUTION) IMPLANT
PACK ARTHROSCOPY DSU (CUSTOM PROCEDURE TRAY) ×1 IMPLANT
PORT APPOLLO RF 90DEGREE MULTI (SURGICAL WAND) ×1 IMPLANT
SLING ARM FOAM STRAP LRG (SOFTGOODS) IMPLANT
SLING ARM FOAM STRAP MED (SOFTGOODS) IMPLANT
SLING ARM FOAM STRAP XLG (SOFTGOODS) IMPLANT
SLING ARM IMMOBILIZER LRG (SOFTGOODS) IMPLANT
SPIKE FLUID TRANSFER (MISCELLANEOUS) IMPLANT
SPONGE T-LAP 4X18 ~~LOC~~+RFID (SPONGE) ×1 IMPLANT
STRIP CLOSURE SKIN 1/2X4 (GAUZE/BANDAGES/DRESSINGS) IMPLANT
SUT ETHILON 3 0 PS 1 (SUTURE) ×1 IMPLANT
TOWEL OR 17X26 10 PK STRL BLUE (TOWEL DISPOSABLE) ×1 IMPLANT
TUBING ARTHROSCOPY IRRIG 16FT (MISCELLANEOUS) ×1 IMPLANT
WATER STERILE IRR 1000ML POUR (IV SOLUTION) ×1 IMPLANT

## 2022-10-28 NOTE — Anesthesia Procedure Notes (Signed)
Procedure Name: Intubation Date/Time: 10/28/2022 10:05 AM  Performed by: Eben Burow, CRNAPre-anesthesia Checklist: Patient identified, Emergency Drugs available, Suction available, Patient being monitored and Timeout performed Patient Re-evaluated:Patient Re-evaluated prior to induction Oxygen Delivery Method: Circle system utilized Preoxygenation: Pre-oxygenation with 100% oxygen Induction Type: IV induction Ventilation: Mask ventilation without difficulty Laryngoscope Size: Mac and 4 Grade View: Grade I Tube type: Oral Tube size: 7.0 mm Number of attempts: 1 Airway Equipment and Method: Stylet Placement Confirmation: ETT inserted through vocal cords under direct vision, positive ETCO2 and breath sounds checked- equal and bilateral Secured at: 22 cm Tube secured with: Tape Dental Injury: Teeth and Oropharynx as per pre-operative assessment

## 2022-10-28 NOTE — Anesthesia Procedure Notes (Signed)
Anesthesia Regional Block: Interscalene brachial plexus block   Pre-Anesthetic Checklist: , timeout performed,  Correct Patient, Correct Site, Correct Laterality,  Correct Procedure, Correct Position, site marked,  Risks and benefits discussed,  Surgical consent,  Pre-op evaluation,  At surgeon's request and post-op pain management  Laterality: Right  Prep: chloraprep       Needles:  Injection technique: Single-shot  Needle Type: Echogenic Stimulator Needle     Needle Length: 9cm  Needle Gauge: 21     Additional Needles:   Procedures:,,,, ultrasound used (permanent image in chart),,    Narrative:  Start time: 10/28/2022 9:20 AM End time: 10/28/2022 9:25 AM Injection made incrementally with aspirations every 5 mL.  Performed by: Personally  Anesthesiologist: Effie Berkshire, MD  Additional Notes: Patient tolerated the procedure well. Local anesthetic introduced in an incremental fashion under minimal resistance after negative aspirations. No paresthesias were elicited. After completion of the procedure, no acute issues were identified and patient continued to be monitored by RN.

## 2022-10-28 NOTE — Interval H&P Note (Signed)
History and Physical Interval Note:  10/28/2022 9:01 AM  [name]  has presented today for surgery, with the diagnosis of [place].  The various methods of treatment have been discussed with the patient and family. After consideration of risks, benefits and other options for treatment, the patient has consented to  Procedure(s): RIGHT SHOULDER ARTHROSCOPY AND ARTHROSCOPIC ROTATOR CUFF REPAIR (Right) as a surgical intervention.  The patient's history has been reviewed, patient examined, no change in status, stable for surgery.  I have reviewed the patient's chart and labs.  Questions were answered to the patient's satisfaction.     Hessie Dibble

## 2022-10-28 NOTE — Transfer of Care (Signed)
Immediate Anesthesia Transfer of Care Note  Patient: [name]  Procedure(s) Performed: RIGHT SHOULDER ARTHROSCOPY  AC RESECTION and DEBRIDMENT (Right: Shoulder)  Patient Location: PACU  Anesthesia Type:General  Level of Consciousness: awake, alert  and patient cooperative  Airway & Oxygen Therapy: Patient Spontanous Breathing and Patient connected to face mask oxygen  Post-op Assessment: Report given to RN and Post -op Vital signs reviewed and stable  Post vital signs: Reviewed and stable  Last Vitals:  Vitals Value Taken Time  BP 135/78 10/28/22 1057  Temp    Pulse 84 10/28/22 1059  Resp 14 10/28/22 1059  SpO2 95 % 10/28/22 1059  Vitals shown include unvalidated device data.  Last Pain:  Vitals:   10/28/22 0916  TempSrc: Oral         Complications: No notable events documented.

## 2022-10-28 NOTE — Op Note (Signed)
[name], [name] MEDICAL RECORD NO: [id number] ACCOUNT NO: [id number] DATE OF BIRTH: 1966/01/08 FACILITY: [facility] LOCATION: [facility] PHYSICIAN: Monico Blitz. Rhona Raider, MD  Operative Report   DATE OF PROCEDURE: 10/28/2022  PREOPERATIVE DIAGNOSES: 1.  Right shoulder biceps degeneration. 2.  Right shoulder AC degeneration. 3.  Right shoulder partial rotator cuff tear.  POSTOPERATIVE DIAGNOSES: 1.  Right shoulder biceps degeneration. 2.  Right shoulder AC degeneration. 3.  Right shoulder partial rotator cuff tear.  PROCEDURE PERFORMED:   1.  Right shoulder arthroscopic debridement. 2.  Right shoulder arthroscopic AC resection.  ANESTHESIA:  General and block.  ATTENDING SURGEON:  Monico Blitz. Essance Gatti, MD.  INDICATIONS FOR PROCEDURE: The patient is a 56 year old woman with a long history of right shoulder issues.  She had an arthroscopy about a year ago for subacromial decompression.  She has experienced recurrent pain, which has been predominantly along  the anterior aspect of the shoulder.  She has responded transiently to [facility] and biceps tendon injections.  Repeat MRI scan shows partial thickness rotator cuff tear along with biceps degeneration and AC arthritis.  She is offered a repeat arthroscopy.   Informed operative consent was obtained after discussion of possible complications including, reaction to anesthesia and infection.  SUMMARY OF FINDINGS AND PROCEDURE:  Under general anesthesia and a block a right shoulder arthroscopy was performed.  Glenohumeral joint showed no degenerative change.  The biceps tendon was torn with a longitudinal split tear and some degeneration.  We  elected to perform a tenolysis.  She had a partial thickness tear of the rotator cuff seen on the undersurface, but nothing significant enough to repair.  She did have bone-on-bone contact at the [facility] joint and a formal AC decompression was done.  She was  scheduled to go home same day.  DESCRIPTION OF  PROCEDURE: The patient was taken to the operating suite where general anesthetic was applied without difficulty.  She was also given a block in the preanesthesia area.  She was positioned in beach chair position and prepped and draped in  normal sterile fashion.  After the administration of preoperative IV Kefzol and appropriate time-out an arthroscopy of the right shoulder was performed through a total of 3 portals.  Findings were as noted above and procedure consisted of the extensive  debridement of the superior labrum.  The biceps tendon, the supraspinatus, and infraspinatus as well as the subacromial bursa.  I then performed a formal AC resection, removing about 10 mm of the distal clavicle with the bur.  The shoulder was thoroughly  irrigated, followed by removal of arthroscopic equipment.  The portals were loosely reapproximated with nylon followed by dry gauze and tape.  Estimated blood loss and intraoperative fluids obtained from anesthesia records.  DISPOSITION: The patient was extubated in the operating room and taken to recovery room in stable condition.  Plans were for her to go home same day and follow up in the office in less than week.  I will contact her by phone tonight.   PUS D: 10/28/2022 10:53:04 am T: 10/28/2022 11:28:00 am  JOB: [id number]/ [id number]

## 2022-10-28 NOTE — Anesthesia Preprocedure Evaluation (Addendum)
Anesthesia Evaluation  Patient identified by MRN, date of birth, ID band Patient awake    Reviewed: Allergy & Precautions, NPO status , Patient's Chart, lab work & pertinent test results  History of Anesthesia Complications (+) PONV and history of anesthetic complications  Airway Mallampati: III  TM Distance: >3 FB Neck ROM: Full    Dental  (+) Teeth Intact, Dental Advisory Given   Pulmonary asthma ,    breath sounds clear to auscultation       Cardiovascular hypertension,  Rhythm:Regular Rate:Normal     Neuro/Psych Anxiety negative neurological ROS     GI/Hepatic negative GI ROS, Neg liver ROS,   Endo/Other  Hypothyroidism   Renal/GU negative Renal ROS     Musculoskeletal  (+) Arthritis ,   Abdominal Normal abdominal exam  (+)   Peds  Hematology negative hematology ROS (+)   Anesthesia Other Findings   Reproductive/Obstetrics                            Anesthesia Physical Anesthesia Plan  ASA: 2  Anesthesia Plan: General   Post-op Pain Management: Regional block*   Induction: Intravenous  PONV Risk Score and Plan: 4 or greater and Ondansetron, Dexamethasone, Midazolam and Scopolamine patch - Pre-op  Airway Management Planned: Oral ETT  Additional Equipment: None  Intra-op Plan:   Post-operative Plan: Extubation in OR  Informed Consent: I have reviewed the patients History and Physical, chart, labs and discussed the procedure including the risks, benefits and alternatives for the proposed anesthesia with the patient or authorized representative who has indicated his/her understanding and acceptance.     Dental advisory given  Plan Discussed with: CRNA  Anesthesia Plan Comments:        Anesthesia Quick Evaluation

## 2022-10-28 NOTE — Brief Op Note (Signed)
LAM BJORKLUND [id number] 10/28/2022   PRE-OP DIAGNOSIS: right sh biceps pain and AC djd  POST-OP DIAGNOSIS: same  PROCEDURE: right sh scope  ANESTHESIA: general and block  Hessie Dibble   Dictation #:  [id number]

## 2022-10-28 NOTE — Anesthesia Postprocedure Evaluation (Signed)
Anesthesia Post Note  Patient: [name]  Procedure(s) Performed: RIGHT SHOULDER ARTHROSCOPY  AC RESECTION and DEBRIDMENT (Right: Shoulder)     Patient location during evaluation: PACU Anesthesia Type: General Level of consciousness: awake and alert Pain management: pain level controlled Vital Signs Assessment: post-procedure vital signs reviewed and stable Respiratory status: spontaneous breathing, nonlabored ventilation, respiratory function stable and patient connected to nasal cannula oxygen Cardiovascular status: blood pressure returned to baseline and stable Postop Assessment: no apparent nausea or vomiting Anesthetic complications: no   No notable events documented.  Last Vitals:  Vitals:   10/28/22 1130 10/28/22 1152  BP: 124/73 107/68  Pulse: 76 75  Resp: 13 15  Temp: 36.7 C 36.6 C  SpO2: 94% 94%    Last Pain:  Vitals:   10/28/22 1152  TempSrc:   PainSc: 0-No pain                 Effie Berkshire

## 2022-10-29 ENCOUNTER — Encounter (HOSPITAL_COMMUNITY): Payer: Self-pay | Admitting: Orthopaedic Surgery

## 2022-12-26 DIAGNOSIS — Z9889 Other specified postprocedural states: Secondary | ICD-10-CM | POA: Diagnosis not present

## 2023-01-15 DIAGNOSIS — M1812 Unilateral primary osteoarthritis of first carpometacarpal joint, left hand: Secondary | ICD-10-CM | POA: Diagnosis not present

## 2023-01-26 DIAGNOSIS — M25511 Pain in right shoulder: Secondary | ICD-10-CM | POA: Diagnosis not present

## 2023-02-03 ENCOUNTER — Other Ambulatory Visit (HOSPITAL_COMMUNITY): Payer: Self-pay | Admitting: Family Medicine

## 2023-02-03 DIAGNOSIS — R059 Cough, unspecified: Secondary | ICD-10-CM | POA: Diagnosis not present

## 2023-02-03 DIAGNOSIS — M1991 Primary osteoarthritis, unspecified site: Secondary | ICD-10-CM | POA: Diagnosis not present

## 2023-02-03 DIAGNOSIS — J45901 Unspecified asthma with (acute) exacerbation: Secondary | ICD-10-CM | POA: Diagnosis not present

## 2023-02-03 DIAGNOSIS — E039 Hypothyroidism, unspecified: Secondary | ICD-10-CM | POA: Diagnosis not present

## 2023-02-03 DIAGNOSIS — E119 Type 2 diabetes mellitus without complications: Secondary | ICD-10-CM | POA: Diagnosis not present

## 2023-02-03 DIAGNOSIS — E6609 Other obesity due to excess calories: Secondary | ICD-10-CM | POA: Diagnosis not present

## 2023-02-03 DIAGNOSIS — M159 Polyosteoarthritis, unspecified: Secondary | ICD-10-CM | POA: Diagnosis not present

## 2023-02-03 DIAGNOSIS — J4521 Mild intermittent asthma with (acute) exacerbation: Secondary | ICD-10-CM

## 2023-02-03 DIAGNOSIS — Z6832 Body mass index (BMI) 32.0-32.9, adult: Secondary | ICD-10-CM | POA: Diagnosis not present

## 2023-02-10 ENCOUNTER — Ambulatory Visit (HOSPITAL_COMMUNITY)
Admission: RE | Admit: 2023-02-10 | Discharge: 2023-02-10 | Disposition: A | Discharge status: Home / Self Care | Payer: 59 | Source: Ambulatory Visit | Attending: Family Medicine | Admitting: Family Medicine

## 2023-02-10 DIAGNOSIS — J4521 Mild intermittent asthma with (acute) exacerbation: Secondary | ICD-10-CM | POA: Diagnosis not present

## 2023-02-10 DIAGNOSIS — J45909 Unspecified asthma, uncomplicated: Secondary | ICD-10-CM | POA: Diagnosis not present

## 2023-02-12 DIAGNOSIS — M1812 Unilateral primary osteoarthritis of first carpometacarpal joint, left hand: Secondary | ICD-10-CM | POA: Diagnosis not present

## 2023-02-12 DIAGNOSIS — M65312 Trigger thumb, left thumb: Secondary | ICD-10-CM | POA: Diagnosis not present

## 2023-02-17 ENCOUNTER — Telehealth: Payer: Self-pay

## 2023-02-17 NOTE — Telephone Encounter (Signed)
Mychart msg sent. AS, CMA

## 2023-03-02 DIAGNOSIS — M25511 Pain in right shoulder: Secondary | ICD-10-CM | POA: Diagnosis not present

## 2023-03-13 DIAGNOSIS — R7989 Other specified abnormal findings of blood chemistry: Secondary | ICD-10-CM | POA: Diagnosis not present

## 2023-03-25 DIAGNOSIS — E6609 Other obesity due to excess calories: Secondary | ICD-10-CM | POA: Diagnosis not present

## 2023-03-25 DIAGNOSIS — R748 Abnormal levels of other serum enzymes: Secondary | ICD-10-CM | POA: Diagnosis not present

## 2023-03-25 DIAGNOSIS — R7989 Other specified abnormal findings of blood chemistry: Secondary | ICD-10-CM | POA: Diagnosis not present

## 2023-03-25 DIAGNOSIS — Z6832 Body mass index (BMI) 32.0-32.9, adult: Secondary | ICD-10-CM | POA: Diagnosis not present

## 2023-03-25 DIAGNOSIS — E1165 Type 2 diabetes mellitus with hyperglycemia: Secondary | ICD-10-CM | POA: Diagnosis not present

## 2023-04-11 DIAGNOSIS — M25511 Pain in right shoulder: Secondary | ICD-10-CM | POA: Diagnosis not present

## 2023-04-21 DIAGNOSIS — R748 Abnormal levels of other serum enzymes: Secondary | ICD-10-CM | POA: Diagnosis not present

## 2023-04-21 DIAGNOSIS — E7800 Pure hypercholesterolemia, unspecified: Secondary | ICD-10-CM | POA: Diagnosis not present

## 2023-04-21 DIAGNOSIS — E1165 Type 2 diabetes mellitus with hyperglycemia: Secondary | ICD-10-CM | POA: Diagnosis not present

## 2023-04-21 DIAGNOSIS — I10 Essential (primary) hypertension: Secondary | ICD-10-CM | POA: Diagnosis not present

## 2023-04-21 DIAGNOSIS — E039 Hypothyroidism, unspecified: Secondary | ICD-10-CM | POA: Diagnosis not present

## 2023-04-29 DIAGNOSIS — E7800 Pure hypercholesterolemia, unspecified: Secondary | ICD-10-CM | POA: Diagnosis not present

## 2023-04-29 DIAGNOSIS — I10 Essential (primary) hypertension: Secondary | ICD-10-CM | POA: Diagnosis not present

## 2023-04-29 DIAGNOSIS — E1165 Type 2 diabetes mellitus with hyperglycemia: Secondary | ICD-10-CM | POA: Diagnosis not present

## 2023-05-01 ENCOUNTER — Other Ambulatory Visit: Payer: Self-pay | Admitting: Orthopaedic Surgery

## 2023-05-01 DIAGNOSIS — M25511 Pain in right shoulder: Secondary | ICD-10-CM

## 2023-05-13 ENCOUNTER — Encounter: Payer: Self-pay | Admitting: Orthopaedic Surgery

## 2023-06-02 DIAGNOSIS — I10 Essential (primary) hypertension: Secondary | ICD-10-CM | POA: Diagnosis not present

## 2023-06-02 DIAGNOSIS — E1165 Type 2 diabetes mellitus with hyperglycemia: Secondary | ICD-10-CM | POA: Diagnosis not present

## 2023-06-02 DIAGNOSIS — E7800 Pure hypercholesterolemia, unspecified: Secondary | ICD-10-CM | POA: Diagnosis not present

## 2023-06-02 DIAGNOSIS — E039 Hypothyroidism, unspecified: Secondary | ICD-10-CM | POA: Diagnosis not present

## 2023-06-02 DIAGNOSIS — R748 Abnormal levels of other serum enzymes: Secondary | ICD-10-CM | POA: Diagnosis not present

## 2023-06-23 ENCOUNTER — Ambulatory Visit
Admission: RE | Admit: 2023-06-23 | Discharge: 2023-06-23 | Disposition: A | Discharge status: Home / Self Care | Payer: 59 | Source: Ambulatory Visit | Attending: Orthopaedic Surgery | Admitting: Orthopaedic Surgery

## 2023-06-23 DIAGNOSIS — M25511 Pain in right shoulder: Secondary | ICD-10-CM

## 2023-07-13 DIAGNOSIS — M25511 Pain in right shoulder: Secondary | ICD-10-CM | POA: Diagnosis not present

## 2023-07-15 DIAGNOSIS — R748 Abnormal levels of other serum enzymes: Secondary | ICD-10-CM | POA: Diagnosis not present

## 2023-07-15 DIAGNOSIS — E1165 Type 2 diabetes mellitus with hyperglycemia: Secondary | ICD-10-CM | POA: Diagnosis not present

## 2023-07-15 DIAGNOSIS — E039 Hypothyroidism, unspecified: Secondary | ICD-10-CM | POA: Diagnosis not present

## 2023-07-15 DIAGNOSIS — Z0001 Encounter for general adult medical examination with abnormal findings: Secondary | ICD-10-CM | POA: Diagnosis not present

## 2023-07-15 DIAGNOSIS — R7989 Other specified abnormal findings of blood chemistry: Secondary | ICD-10-CM | POA: Diagnosis not present

## 2023-07-15 DIAGNOSIS — M159 Polyosteoarthritis, unspecified: Secondary | ICD-10-CM | POA: Diagnosis not present

## 2023-07-15 DIAGNOSIS — Z1231 Encounter for screening mammogram for malignant neoplasm of breast: Secondary | ICD-10-CM | POA: Diagnosis not present

## 2023-07-15 DIAGNOSIS — E782 Mixed hyperlipidemia: Secondary | ICD-10-CM | POA: Diagnosis not present

## 2023-07-15 DIAGNOSIS — E6609 Other obesity due to excess calories: Secondary | ICD-10-CM | POA: Diagnosis not present

## 2023-07-15 DIAGNOSIS — I10 Essential (primary) hypertension: Secondary | ICD-10-CM | POA: Diagnosis not present

## 2023-07-15 DIAGNOSIS — R946 Abnormal results of thyroid function studies: Secondary | ICD-10-CM | POA: Diagnosis not present

## 2023-07-15 DIAGNOSIS — Z1331 Encounter for screening for depression: Secondary | ICD-10-CM | POA: Diagnosis not present

## 2023-07-15 DIAGNOSIS — M1991 Primary osteoarthritis, unspecified site: Secondary | ICD-10-CM | POA: Diagnosis not present

## 2023-07-16 ENCOUNTER — Other Ambulatory Visit (HOSPITAL_COMMUNITY): Payer: Self-pay | Admitting: Family Medicine

## 2023-07-16 DIAGNOSIS — Z1231 Encounter for screening mammogram for malignant neoplasm of breast: Secondary | ICD-10-CM

## 2023-07-22 ENCOUNTER — Encounter (HOSPITAL_COMMUNITY): Payer: Self-pay

## 2023-07-22 ENCOUNTER — Ambulatory Visit (HOSPITAL_COMMUNITY)
Admission: RE | Admit: 2023-07-22 | Discharge: 2023-07-22 | Disposition: A | Discharge status: Home / Self Care | Payer: 59 | Source: Ambulatory Visit | Attending: Family Medicine | Admitting: Family Medicine

## 2023-07-22 DIAGNOSIS — Z1231 Encounter for screening mammogram for malignant neoplasm of breast: Secondary | ICD-10-CM | POA: Insufficient documentation

## 2023-07-27 ENCOUNTER — Other Ambulatory Visit (HOSPITAL_COMMUNITY): Payer: Self-pay | Admitting: Family Medicine

## 2023-07-27 DIAGNOSIS — R928 Other abnormal and inconclusive findings on diagnostic imaging of breast: Secondary | ICD-10-CM

## 2023-08-04 ENCOUNTER — Ambulatory Visit (HOSPITAL_COMMUNITY)
Admission: RE | Admit: 2023-08-04 | Discharge: 2023-08-04 | Disposition: A | Discharge status: Home / Self Care | Payer: 59 | Source: Ambulatory Visit | Attending: Family Medicine | Admitting: Family Medicine

## 2023-08-04 ENCOUNTER — Encounter (HOSPITAL_COMMUNITY): Payer: Self-pay

## 2023-08-04 DIAGNOSIS — R928 Other abnormal and inconclusive findings on diagnostic imaging of breast: Secondary | ICD-10-CM | POA: Diagnosis not present

## 2023-08-04 DIAGNOSIS — N6311 Unspecified lump in the right breast, upper outer quadrant: Secondary | ICD-10-CM | POA: Diagnosis not present

## 2023-08-12 ENCOUNTER — Encounter (INDEPENDENT_AMBULATORY_CARE_PROVIDER_SITE_OTHER): Payer: Self-pay | Admitting: *Deleted

## 2023-08-27 DIAGNOSIS — M19011 Primary osteoarthritis, right shoulder: Secondary | ICD-10-CM | POA: Diagnosis not present

## 2023-08-27 DIAGNOSIS — M7581 Other shoulder lesions, right shoulder: Secondary | ICD-10-CM | POA: Diagnosis not present

## 2023-08-27 DIAGNOSIS — M659 Synovitis and tenosynovitis, unspecified: Secondary | ICD-10-CM | POA: Diagnosis not present

## 2023-08-27 DIAGNOSIS — M67813 Other specified disorders of tendon, right shoulder: Secondary | ICD-10-CM | POA: Diagnosis not present

## 2023-08-27 DIAGNOSIS — G8918 Other acute postprocedural pain: Secondary | ICD-10-CM | POA: Diagnosis not present

## 2023-08-27 DIAGNOSIS — M75110 Incomplete rotator cuff tear or rupture of unspecified shoulder, not specified as traumatic: Secondary | ICD-10-CM | POA: Diagnosis not present

## 2023-08-27 DIAGNOSIS — M75111 Incomplete rotator cuff tear or rupture of right shoulder, not specified as traumatic: Secondary | ICD-10-CM | POA: Diagnosis not present

## 2023-09-18 DIAGNOSIS — M25611 Stiffness of right shoulder, not elsewhere classified: Secondary | ICD-10-CM | POA: Diagnosis not present

## 2023-09-18 DIAGNOSIS — M6281 Muscle weakness (generalized): Secondary | ICD-10-CM | POA: Diagnosis not present

## 2023-09-24 DIAGNOSIS — M25611 Stiffness of right shoulder, not elsewhere classified: Secondary | ICD-10-CM | POA: Diagnosis not present

## 2023-09-24 DIAGNOSIS — M6281 Muscle weakness (generalized): Secondary | ICD-10-CM | POA: Diagnosis not present

## 2023-09-29 DIAGNOSIS — M6281 Muscle weakness (generalized): Secondary | ICD-10-CM | POA: Diagnosis not present

## 2023-09-29 DIAGNOSIS — M25611 Stiffness of right shoulder, not elsewhere classified: Secondary | ICD-10-CM | POA: Diagnosis not present

## 2023-10-01 DIAGNOSIS — M65312 Trigger thumb, left thumb: Secondary | ICD-10-CM | POA: Diagnosis not present

## 2023-10-01 DIAGNOSIS — M1812 Unilateral primary osteoarthritis of first carpometacarpal joint, left hand: Secondary | ICD-10-CM | POA: Diagnosis not present

## 2023-10-06 DIAGNOSIS — M25611 Stiffness of right shoulder, not elsewhere classified: Secondary | ICD-10-CM | POA: Diagnosis not present

## 2023-10-06 DIAGNOSIS — M6281 Muscle weakness (generalized): Secondary | ICD-10-CM | POA: Diagnosis not present

## 2023-10-09 DIAGNOSIS — R531 Weakness: Secondary | ICD-10-CM | POA: Diagnosis not present

## 2023-10-09 DIAGNOSIS — M25612 Stiffness of left shoulder, not elsewhere classified: Secondary | ICD-10-CM | POA: Diagnosis not present

## 2023-10-13 DIAGNOSIS — M25611 Stiffness of right shoulder, not elsewhere classified: Secondary | ICD-10-CM | POA: Diagnosis not present

## 2023-10-13 DIAGNOSIS — M6281 Muscle weakness (generalized): Secondary | ICD-10-CM | POA: Diagnosis not present

## 2023-10-15 DIAGNOSIS — M6281 Muscle weakness (generalized): Secondary | ICD-10-CM | POA: Diagnosis not present

## 2023-10-15 DIAGNOSIS — M25611 Stiffness of right shoulder, not elsewhere classified: Secondary | ICD-10-CM | POA: Diagnosis not present

## 2023-10-20 DIAGNOSIS — M25611 Stiffness of right shoulder, not elsewhere classified: Secondary | ICD-10-CM | POA: Diagnosis not present

## 2023-10-20 DIAGNOSIS — M6281 Muscle weakness (generalized): Secondary | ICD-10-CM | POA: Diagnosis not present

## 2023-10-22 ENCOUNTER — Encounter: Payer: Self-pay | Admitting: Internal Medicine

## 2023-10-22 ENCOUNTER — Ambulatory Visit: Payer: Medicare Other | Admitting: Internal Medicine

## 2023-10-22 VITALS — BP 137/89 | HR 70 | Temp 97.8°F

## 2023-10-22 DIAGNOSIS — R197 Diarrhea, unspecified: Secondary | ICD-10-CM | POA: Diagnosis not present

## 2023-10-22 DIAGNOSIS — R7989 Other specified abnormal findings of blood chemistry: Secondary | ICD-10-CM

## 2023-10-22 DIAGNOSIS — F1090 Alcohol use, unspecified, uncomplicated: Secondary | ICD-10-CM | POA: Diagnosis not present

## 2023-10-22 DIAGNOSIS — Z8719 Personal history of other diseases of the digestive system: Secondary | ICD-10-CM | POA: Diagnosis not present

## 2023-10-22 DIAGNOSIS — Z1211 Encounter for screening for malignant neoplasm of colon: Secondary | ICD-10-CM

## 2023-10-22 DIAGNOSIS — K529 Noninfective gastroenteritis and colitis, unspecified: Secondary | ICD-10-CM

## 2023-10-22 DIAGNOSIS — E663 Overweight: Secondary | ICD-10-CM

## 2023-10-22 NOTE — Patient Instructions (Signed)
We will schedule you for colonoscopy for colon cancer screening purposes as well as to evaluate your history of colitis and chronic diarrhea.  Will plan on rechecking your liver function tests in 3 months.  If still abnormal, we may need to perform further workup.  It was very nice meeting you today.  Dr. Marletta Lor

## 2023-10-22 NOTE — Progress Notes (Signed)
Primary Care Physician:  Assunta Found, MD Primary Gastroenterologist:  Dr. Marletta Lor  Chief Complaint  Patient presents with   New Patient (Initial Visit)    Pt here for ov before colonoscopy    HPI:   [name] is a 57 y.o. female who presents to the clinic today by referral from her PCP Dr. Phillips Odor for evaluation.  Colon cancer screening: Patient due for screening colonoscopy.  Denies any family history of colorectal malignancy.  No melena hematochezia.  No abdominal pain.  No unintentional weight loss.  Last colonoscopy 2006.  I do not have colonoscopy report though pathology report reveals biopsies of the transverse colon which showed mild chronic active colitis, sigmoid colon mild chronic active colitis, rectal biopsies benign.  Patient does note intermittently having loose stools.  Will occasionally have formed stool.  No mucus in her stool.  No abdominal pain.  Abnormal LFTs: Blood work 07/16/2023 AST 120, ALT 61, alk phos 111, T. bili 0.7, normal albumin, platelets 279.  She states these are being followed and have already improved.  Denies any chronic alcohol use, no illicit drug use.  No exposure to viral hepatitis.   Past Medical History:  Diagnosis Date   Anxiety    Arthritis    Asthma    seasonal   Hypercholesteremia    Hypertension    Hypothyroidism    PONV (postoperative nausea and vomiting)    Thyroid disease     Past Surgical History:  Procedure Laterality Date   APPENDECTOMY     age 50   CARPAL TUNNEL RELEASE Right 2015   GALLBLADDER SURGERY  2006   KNEE ARTHROSCOPY Right    x3   KNEE ARTHROSCOPY Right 09/09/2022   Procedure: RIGHT KNEE ARTHROSCOPY, chondroplasty;  Surgeon: Marcene Corning, MD;  Location: [facility];  Service: Orthopedics;  Laterality: Right;   NECK SURGERY  2018   c-4 disc   ROTATOR CUFF REPAIR Right    06/2021   SHOULDER ARTHROSCOPY WITH ROTATOR CUFF REPAIR Right 10/28/2022   Procedure: RIGHT SHOULDER ARTHROSCOPY  AC  RESECTION and DEBRIDMENT;  Surgeon: Marcene Corning, MD;  Location: [facility];  Service: Orthopedics;  Laterality: Right;   SPINAL FUSION  2008   L5   THUMB ARTHROSCOPY     02/2021   WISDOM TOOTH EXTRACTION     age 34   WRIST ARTHROSCOPY WITH DEBRIDEMENT Right 12/16/2021   Procedure: RIGHT WRIST SCOPE AND DEBRIDEMENT;  Surgeon: Mack Hook, MD;  Location: [facility];  Service: Orthopedics;  Laterality: Right;  LENGTH OF SURGERY: 90 MINUTES PRE-OP REGIONAL BLOCK    Current Outpatient Medications  Medication Sig Dispense Refill   albuterol (VENTOLIN HFA) 108 (90 Base) MCG/ACT inhaler Inhale 2 puffs into the lungs every 6 (six) hours as needed for wheezing or shortness of breath.     atenolol (TENORMIN) 25 MG tablet Take 25 mg by mouth daily.     Budeson-Glycopyrrol-Formoterol (BREZTRI AEROSPHERE) 160-9-4.8 MCG/ACT AERO Inhale 2-3 puffs into the lungs daily as needed (shortness of breath).     cholecalciferol (VITAMIN D3) 25 MCG (1000 UNIT) tablet Take 1,000 Units by mouth daily.     cyanocobalamin (VITAMIN B12) 1000 MCG tablet Take 1,000 mcg by mouth daily.     Dulaglutide (TRULICITY) 1.5 MG/0.5ML SOAJ Inject into the skin once a week.     escitalopram (LEXAPRO) 20 MG tablet Take 20 mg by mouth daily.     levothyroxine (SYNTHROID, LEVOTHROID) 25 MCG tablet Take 25 mcg  by mouth daily before breakfast.     meclizine (ANTIVERT) 25 MG tablet Take 25 mg by mouth 3 (three) times daily as needed for dizziness.     metFORMIN (GLUCOPHAGE) 1000 MG tablet Take 1,000 mg by mouth 2 (two) times daily with a meal.     Misc Natural Products (DAILY HERBS IMMUNE DEFENSE PO) Take 1-2 capsules by mouth daily.     montelukast (SINGULAIR) 10 MG tablet Take 10 mg by mouth at bedtime.     oxymetazoline (AFRIN) 0.05 % nasal spray Place 1 spray into both nostrils 2 (two) times daily as needed for congestion.     rosuvastatin (CRESTOR) 5 MG tablet Take 5 mg by mouth daily.     acetaminophen (TYLENOL)  500 MG tablet Take 1,000 mg by mouth every 6 (six) hours as needed for moderate pain. (Patient not taking: Reported on 10/22/2023)     HYDROcodone-acetaminophen (NORCO/VICODIN) 5-325 MG tablet Take 1 tablet by mouth every 6 (six) hours as needed for moderate pain or severe pain (post op pain max 6 per day). (Patient not taking: Reported on 10/22/2023) 30 tablet 0   pravastatin (PRAVACHOL) 20 MG tablet Take 20 mg by mouth daily. (Patient not taking: Reported on 10/22/2023)     promethazine (PHENERGAN) 12.5 MG tablet Take 1 tablet (12.5 mg total) by mouth every 6 (six) hours as needed for nausea or vomiting. (Patient not taking: Reported on 10/22/2023) 20 tablet 0   No current facility-administered medications for this visit.    Allergies as of 10/22/2023 - Review Complete 10/22/2023  Allergen Reaction Noted   Latex Anaphylaxis 09/29/2017    Family History  Problem Relation Age of Onset   Myasthenia gravis Father    Hypertension Mother     Social History   Socioeconomic History   Marital status: Widowed    Spouse name: Not on file   Number of children: Not on file   Years of education: Not on file   Highest education level: Not on file  Occupational History   Not on file  Tobacco Use   Smoking status: Never   Smokeless tobacco: Never  Vaping Use   Vaping status: Never Used  Substance and Sexual Activity   Alcohol use: Yes    Comment: 1-2 a month   Drug use: No   Sexual activity: Yes    Birth control/protection: Post-menopausal  Other Topics Concern   Not on file  Social History Narrative   Not on file   Social Determinants of Health   Financial Resource Strain: Not on file  Food Insecurity: Not on file  Transportation Needs: Not on file  Physical Activity: Not on file  Stress: Not on file  Social Connections: Not on file  Intimate Partner Violence: Not on file    Subjective: Review of Systems  Constitutional:  Negative for chills and fever.  HENT:  Negative  for congestion and hearing loss.   Eyes:  Negative for blurred vision and double vision.  Respiratory:  Negative for cough and shortness of breath.   Cardiovascular:  Negative for chest pain and palpitations.  Gastrointestinal:  Negative for abdominal pain, blood in stool, constipation, diarrhea, heartburn, melena and vomiting.  Genitourinary:  Negative for dysuria and urgency.  Musculoskeletal:  Negative for joint pain and myalgias.  Skin:  Negative for itching and rash.  Neurological:  Negative for dizziness and headaches.  Psychiatric/Behavioral:  Negative for depression. The patient is not nervous/anxious.        Objective:  BP 137/89   Pulse 70   Temp 97.8 F (36.6 C)   LMP  (LMP Unknown)  Physical Exam Constitutional:      Appearance: Normal appearance.  HENT:     Head: Normocephalic and atraumatic.  Eyes:     Extraocular Movements: Extraocular movements intact.     Conjunctiva/sclera: Conjunctivae normal.  Cardiovascular:     Rate and Rhythm: Normal rate and regular rhythm.  Pulmonary:     Effort: Pulmonary effort is normal.     Breath sounds: Normal breath sounds.  Abdominal:     General: Bowel sounds are normal.     Palpations: Abdomen is soft.  Musculoskeletal:        General: No swelling. Normal range of motion.     Cervical back: Normal range of motion and neck supple.  Skin:    General: Skin is warm and dry.     Coloration: Skin is not jaundiced.  Neurological:     General: No focal deficit present.     Mental Status: She is alert and oriented to person, place, and time.  Psychiatric:        Mood and Affect: Mood normal.        Behavior: Behavior normal.      Assessment: *Colon cancer screening *History of colitis *Intermittent diarrhea *Abnormal LFTs  Plan: Will schedule for screening colonoscopy.The risks including infection, bleed, or perforation as well as benefits, limitations, alternatives and imponderables have been reviewed with the  patient. Questions have been answered. All parties agreeable.  In regards to her abnormal LFTs, she states these are already improving.  Will recheck in 3 months, if still elevated will pursue further workup serologically and with ultrasound/elastography.  She may have a component of MASH in the setting of being overweight, dyslipidemia, diabetes.  Recommend 1-2# weight loss per week until ideal body weight through exercise & diet. Low fat/cholesterol diet.   Avoid sweets, sodas, fruit juices, sweetened beverages like tea, etc. Gradually increase exercise from 15 min daily up to 1 hr per day 5 days/week. Limit alcohol use.   Thank you Dr. Phillips Odor for the kind referral  10/22/2023 2:25 PM   Disclaimer: This note was dictated with voice recognition software. Similar sounding words can inadvertently be transcribed and may not be corrected upon review.

## 2023-10-23 ENCOUNTER — Telehealth: Payer: Self-pay | Admitting: *Deleted

## 2023-10-23 ENCOUNTER — Encounter: Payer: Self-pay | Admitting: *Deleted

## 2023-10-23 ENCOUNTER — Other Ambulatory Visit: Payer: Self-pay | Admitting: *Deleted

## 2023-10-23 MED ORDER — PEG 3350-KCL-NA BICARB-NACL 420 G PO SOLR: 4000.0000 mL | Freq: Once | ORAL | 0 refills | Status: AC

## 2023-10-23 NOTE — Telephone Encounter (Signed)
[employer] PA:  Notification or Prior Authorization is not required for the requested services You are not required to submit a notification/prior authorization based on the information provided. If you have general questions about the prior authorization requirements, visit [email or web address] > Clinician Resources > Advance and Admission Notification Requirements. The number above acknowledges your notification. Please write this reference number down for future reference. If you would like to request an organization determination, please call us at [phone]. Decision ID #: [id number]

## 2023-10-26 ENCOUNTER — Encounter: Payer: Self-pay | Admitting: *Deleted

## 2023-10-27 DIAGNOSIS — M6281 Muscle weakness (generalized): Secondary | ICD-10-CM | POA: Diagnosis not present

## 2023-10-27 DIAGNOSIS — M25611 Stiffness of right shoulder, not elsewhere classified: Secondary | ICD-10-CM | POA: Diagnosis not present

## 2023-10-29 DIAGNOSIS — M25611 Stiffness of right shoulder, not elsewhere classified: Secondary | ICD-10-CM | POA: Diagnosis not present

## 2023-10-29 DIAGNOSIS — M6281 Muscle weakness (generalized): Secondary | ICD-10-CM | POA: Diagnosis not present

## 2023-11-04 DIAGNOSIS — M25611 Stiffness of right shoulder, not elsewhere classified: Secondary | ICD-10-CM | POA: Diagnosis not present

## 2023-11-04 DIAGNOSIS — M6281 Muscle weakness (generalized): Secondary | ICD-10-CM | POA: Diagnosis not present

## 2023-11-05 DIAGNOSIS — E1165 Type 2 diabetes mellitus with hyperglycemia: Secondary | ICD-10-CM | POA: Diagnosis not present

## 2023-11-05 DIAGNOSIS — E039 Hypothyroidism, unspecified: Secondary | ICD-10-CM | POA: Diagnosis not present

## 2023-11-05 DIAGNOSIS — E7800 Pure hypercholesterolemia, unspecified: Secondary | ICD-10-CM | POA: Diagnosis not present

## 2023-11-09 DIAGNOSIS — M25611 Stiffness of right shoulder, not elsewhere classified: Secondary | ICD-10-CM | POA: Diagnosis not present

## 2023-11-09 DIAGNOSIS — M6281 Muscle weakness (generalized): Secondary | ICD-10-CM | POA: Diagnosis not present

## 2023-11-11 DIAGNOSIS — M6281 Muscle weakness (generalized): Secondary | ICD-10-CM | POA: Diagnosis not present

## 2023-11-11 DIAGNOSIS — M25611 Stiffness of right shoulder, not elsewhere classified: Secondary | ICD-10-CM | POA: Diagnosis not present

## 2023-11-13 DIAGNOSIS — E1165 Type 2 diabetes mellitus with hyperglycemia: Secondary | ICD-10-CM | POA: Diagnosis not present

## 2023-11-13 DIAGNOSIS — E039 Hypothyroidism, unspecified: Secondary | ICD-10-CM | POA: Diagnosis not present

## 2023-11-13 DIAGNOSIS — R748 Abnormal levels of other serum enzymes: Secondary | ICD-10-CM | POA: Diagnosis not present

## 2023-11-13 DIAGNOSIS — I10 Essential (primary) hypertension: Secondary | ICD-10-CM | POA: Diagnosis not present

## 2023-11-13 DIAGNOSIS — E7800 Pure hypercholesterolemia, unspecified: Secondary | ICD-10-CM | POA: Diagnosis not present

## 2023-11-19 DIAGNOSIS — M25611 Stiffness of right shoulder, not elsewhere classified: Secondary | ICD-10-CM | POA: Diagnosis not present

## 2023-11-19 DIAGNOSIS — M6281 Muscle weakness (generalized): Secondary | ICD-10-CM | POA: Diagnosis not present

## 2023-11-23 NOTE — Patient Instructions (Addendum)
[name]  11/23/2023     @PREFPERIOPPHARMACY @   Your procedure is scheduled on  11/30/2023.   Report to [facility] at  0830  A.M.   Call this number if you have problems the morning of surgery:  [phone]  If you experience any cold or flu symptoms such as cough, fever, chills, shortness of breath, etc. between now and your scheduled surgery, please notify us at the above number.   Remember:        Your last dose of trulicity should be on 11/22/2023.        Use your inhaler before you come and bring your rescue inhaler with you.    Follow the diet and prep instructions given to you by the office.   You may drink clear liquids until 0630 am on 11/30/2023.   Clear liquids allowed are:                    Water, Juice (No red color; non-citric and without pulp; diabetics please choose diet or no sugar options), Carbonated beverages (diabetics please choose diet or no sugar options), Clear Tea (No creamer, milk, or cream, including half & half and powdered creamer), Black Coffee Only (No creamer, milk or cream, including half & half and powdered creamer), and Clear Sports drink (No red color; diabetics please choose diet or no sugar options)    Take these medicines the morning of surgery with A SIP OF WATER      atenolol, escitalopram, levothyroxine, meclizine(if needed).     Do not wear jewelry, make-up or nail polish, including gel polish,  artificial nails, or any other type of covering on natural nails (fingers and  toes).  Do not wear lotions, powders, or perfumes, or deodorant.  Do not shave 48 hours prior to surgery.  Men may shave face and neck.  Do not bring valuables to the hospital.  [facility] is not responsible for any belongings or valuables.  Contacts, dentures or bridgework may not be worn into surgery.  Leave your suitcase in the car.  After surgery it may be brought to your room.  For patients admitted to the hospital, discharge time  will be determined by your treatment team.  Patients discharged the day of surgery will not be allowed to drive home and must have someone with them for 24 hours.    Special instructions:   DO NOT smoke tobacco or vape for 24 hours before your procedure.  Please read over the following fact sheets that you were given. Anesthesia Post-op Instructions and Care and Recovery After Surgery      Colonoscopy, Adult, Care After The following information offers guidance on how to care for yourself after your procedure. Your health care provider may also give you more specific instructions. If you have problems or questions, contact your health care provider. What can I expect after the procedure? After the procedure, it is common to have: A small amount of blood in your stool for 24 hours after the procedure. Some gas. Mild cramping or bloating of your abdomen. Follow these instructions at home: Eating and drinking  Drink enough fluid to keep your urine pale yellow. Follow instructions from your health care provider about eating or drinking restrictions. Resume your normal diet as told by your health care provider. Avoid heavy or fried foods that are hard to digest. Activity Rest as told by your health care provider. Avoid sitting for a  long time without moving. Get up to take short walks every 1-2 hours. This is important to improve blood flow and breathing. Ask for help if you feel weak or unsteady. Return to your normal activities as told by your health care provider. Ask your health care provider what activities are safe for you. Managing cramping and bloating  Try walking around when you have cramps or feel bloated. If directed, apply heat to your abdomen as told by your health care provider. Use the heat source that your health care provider recommends, such as a moist heat pack or a heating pad. Place a towel between your skin and the heat source. Leave the heat on for 20-30  minutes. Remove the heat if your skin turns bright red. This is especially important if you are unable to feel pain, heat, or cold. You have a greater risk of getting burned. General instructions If you were given a sedative during the procedure, it can affect you for several hours. Do not drive or operate machinery until your health care provider says that it is safe. For the first 24 hours after the procedure: Do not sign important documents. Do not drink alcohol. Do your regular daily activities at a slower pace than normal. Eat soft foods that are easy to digest. Take over-the-counter and prescription medicines only as told by your health care provider. Keep all follow-up visits. This is important. Contact a health care provider if: You have blood in your stool 2-3 days after the procedure. Get help right away if: You have more than a small spotting of blood in your stool. You have large blood clots in your stool. You have swelling of your abdomen. You have nausea or vomiting. You have a fever. You have increasing pain in your abdomen that is not relieved with medicine. These symptoms may be an emergency. Get help right away. Call 911. Do not wait to see if the symptoms will go away. Do not drive yourself to the hospital. Summary After the procedure, it is common to have a small amount of blood in your stool. You may also have mild cramping and bloating of your abdomen. If you were given a sedative during the procedure, it can affect you for several hours. Do not drive or operate machinery until your health care provider says that it is safe. Get help right away if you have a lot of blood in your stool, nausea or vomiting, a fever, or increased pain in your abdomen. This information is not intended to replace advice given to you by your health care provider. Make sure you discuss any questions you have with your health care provider. Document Revised: 01/27/2023 Document Reviewed:  08/07/2021 Elsevier Patient Education  2024 [employer] Monitored Anesthesia Care, Care After The following information offers guidance on how to care for yourself after your procedure. Your health care provider may also give you more specific instructions. If you have problems or questions, contact your health care provider. What can I expect after the procedure? After the procedure, it is common to have: Tiredness. Little or no memory about what happened during or after the procedure. Impaired judgment when it comes to making decisions. Nausea or vomiting. Some trouble with balance. Follow these instructions at home: For the time period you were told by your health care provider:  Rest. Do not participate in activities where you could fall or become injured. Do not drive or use machinery. Do not drink alcohol. Do not take sleeping pills  or medicines that cause drowsiness. Do not make important decisions or sign legal documents. Do not take care of children on your own. Medicines Take over-the-counter and prescription medicines only as told by your health care provider. If you were prescribed antibiotics, take them as told by your health care provider. Do not stop using the antibiotic even if you start to feel better. Eating and drinking Follow instructions from your health care provider about what you may eat and drink. Drink enough fluid to keep your urine pale yellow. If you vomit: Drink clear fluids slowly and in small amounts as you are able. Clear fluids include water, ice chips, low-calorie sports drinks, and fruit juice that has water added to it (diluted fruit juice). Eat light and bland foods in small amounts as you are able. These foods include bananas, applesauce, rice, lean meats, toast, and crackers. General instructions  Have a responsible adult stay with you for the time you are told. It is important to have someone help care for you until you are awake and  alert. If you have sleep apnea, surgery and some medicines can increase your risk for breathing problems. Follow instructions from your health care provider about wearing your sleep device: When you are sleeping. This includes during daytime naps. While taking prescription pain medicines, sleeping medicines, or medicines that make you drowsy. Do not use any products that contain nicotine or tobacco. These products include cigarettes, chewing tobacco, and vaping devices, such as e-cigarettes. If you need help quitting, ask your health care provider. Contact a health care provider if: You feel nauseous or vomit every time you eat or drink. You feel light-headed. You are still sleepy or having trouble with balance after 24 hours. You get a rash. You have a fever. You have redness or swelling around the IV site. Get help right away if: You have trouble breathing. You have new confusion after you get home. These symptoms may be an emergency. Get help right away. Call 911. Do not wait to see if the symptoms will go away. Do not drive yourself to the hospital. This information is not intended to replace advice given to you by your health care provider. Make sure you discuss any questions you have with your health care provider. Document Revised: 05/12/2022 Document Reviewed: 05/12/2022 Elsevier Patient Education  2024 [employer].

## 2023-11-25 ENCOUNTER — Encounter (HOSPITAL_COMMUNITY): Payer: Self-pay

## 2023-11-25 ENCOUNTER — Other Ambulatory Visit: Payer: Self-pay

## 2023-11-25 ENCOUNTER — Encounter (HOSPITAL_COMMUNITY)
Admission: RE | Admit: 2023-11-25 | Discharge: 2023-11-25 | Disposition: A | Discharge status: Home / Self Care | Payer: Medicare Other | Source: Ambulatory Visit | Attending: Internal Medicine | Admitting: Internal Medicine

## 2023-11-25 VITALS — BP 124/68 | HR 74 | Temp 97.8°F | Resp 18 | Ht 66.0 in | Wt 195.1 lb

## 2023-11-25 DIAGNOSIS — Z01818 Encounter for other preprocedural examination: Secondary | ICD-10-CM | POA: Insufficient documentation

## 2023-11-25 DIAGNOSIS — R9431 Abnormal electrocardiogram [ECG] [EKG]: Secondary | ICD-10-CM | POA: Diagnosis not present

## 2023-11-25 DIAGNOSIS — I10 Essential (primary) hypertension: Secondary | ICD-10-CM

## 2023-11-25 DIAGNOSIS — E119 Type 2 diabetes mellitus without complications: Secondary | ICD-10-CM | POA: Diagnosis not present

## 2023-11-25 HISTORY — DX: Type 2 diabetes mellitus without complications: E11.9

## 2023-11-25 LAB — BASIC METABOLIC PANEL
Anion gap: 12 (ref 5–15)
BUN: 13 mg/dL (ref 6–20)
CO2: 21 mmol/L — ABNORMAL LOW (ref 22–32)
Calcium: 9.5 mg/dL (ref 8.9–10.3)
Chloride: 104 mmol/L (ref 98–111)
Creatinine, Ser: 0.64 mg/dL (ref 0.44–1.00)
GFR, Estimated: >60 mL/min (ref >60)
Glucose, Bld: 147 mg/dL — ABNORMAL HIGH (ref 70–99)
Potassium: 3.5 mmol/L (ref 3.5–5.1)
Sodium: 137 mmol/L (ref 135–145)

## 2023-11-30 ENCOUNTER — Encounter (HOSPITAL_COMMUNITY)
Admission: RE | Disposition: A | Discharge status: Home / Self Care | Payer: Self-pay | Source: Home / Self Care | Attending: Internal Medicine

## 2023-11-30 ENCOUNTER — Ambulatory Visit (HOSPITAL_COMMUNITY)
Admission: RE | Admit: 2023-11-30 | Discharge: 2023-11-30 | Disposition: A | Discharge status: Home / Self Care | Payer: Medicare Other | Attending: Internal Medicine | Admitting: Internal Medicine

## 2023-11-30 ENCOUNTER — Encounter (HOSPITAL_COMMUNITY): Payer: Self-pay

## 2023-11-30 ENCOUNTER — Ambulatory Visit (HOSPITAL_COMMUNITY): Payer: Medicare Other | Admitting: Anesthesiology

## 2023-11-30 DIAGNOSIS — E039 Hypothyroidism, unspecified: Secondary | ICD-10-CM | POA: Diagnosis not present

## 2023-11-30 DIAGNOSIS — F419 Anxiety disorder, unspecified: Secondary | ICD-10-CM | POA: Insufficient documentation

## 2023-11-30 DIAGNOSIS — Z1211 Encounter for screening for malignant neoplasm of colon: Secondary | ICD-10-CM | POA: Diagnosis not present

## 2023-11-30 DIAGNOSIS — Z7984 Long term (current) use of oral hypoglycemic drugs: Secondary | ICD-10-CM | POA: Insufficient documentation

## 2023-11-30 DIAGNOSIS — M1990 Unspecified osteoarthritis, unspecified site: Secondary | ICD-10-CM | POA: Diagnosis not present

## 2023-11-30 DIAGNOSIS — Z8249 Family history of ischemic heart disease and other diseases of the circulatory system: Secondary | ICD-10-CM | POA: Diagnosis not present

## 2023-11-30 DIAGNOSIS — I10 Essential (primary) hypertension: Secondary | ICD-10-CM | POA: Diagnosis not present

## 2023-11-30 DIAGNOSIS — D123 Benign neoplasm of transverse colon: Secondary | ICD-10-CM | POA: Diagnosis not present

## 2023-11-30 DIAGNOSIS — Z7985 Long-term (current) use of injectable non-insulin antidiabetic drugs: Secondary | ICD-10-CM | POA: Diagnosis not present

## 2023-11-30 DIAGNOSIS — D125 Benign neoplasm of sigmoid colon: Secondary | ICD-10-CM | POA: Insufficient documentation

## 2023-11-30 DIAGNOSIS — K635 Polyp of colon: Secondary | ICD-10-CM

## 2023-11-30 DIAGNOSIS — E119 Type 2 diabetes mellitus without complications: Secondary | ICD-10-CM | POA: Insufficient documentation

## 2023-11-30 DIAGNOSIS — D122 Benign neoplasm of ascending colon: Secondary | ICD-10-CM

## 2023-11-30 DIAGNOSIS — J45909 Unspecified asthma, uncomplicated: Secondary | ICD-10-CM | POA: Insufficient documentation

## 2023-11-30 DIAGNOSIS — Z139 Encounter for screening, unspecified: Secondary | ICD-10-CM | POA: Diagnosis not present

## 2023-11-30 HISTORY — PX: POLYPECTOMY: SHX5525

## 2023-11-30 HISTORY — PX: COLONOSCOPY WITH PROPOFOL: SHX5780

## 2023-11-30 LAB — GLUCOSE, CAPILLARY: Glucose-Capillary: 163 mg/dL — ABNORMAL HIGH (ref 70–99)

## 2023-11-30 SURGERY — COLONOSCOPY WITH PROPOFOL
Anesthesia: General

## 2023-11-30 MED ORDER — LACTATED RINGERS IV SOLN: INTRAVENOUS | Status: DC | PRN

## 2023-11-30 MED ORDER — STERILE WATER FOR IRRIGATION IR SOLN
Status: DC | PRN
  Administered 2023-11-30: 100 mL

## 2023-11-30 MED ORDER — PROPOFOL 10 MG/ML IV BOLUS
INTRAVENOUS | Status: DC | PRN
  Administered 2023-11-30: 150 ug/kg/min via INTRAVENOUS
  Administered 2023-11-30: 100 mg via INTRAVENOUS

## 2023-11-30 MED ORDER — LACTATED RINGERS IV SOLN: INTRAVENOUS | Status: DC

## 2023-11-30 NOTE — Anesthesia Preprocedure Evaluation (Signed)
Anesthesia Evaluation  Patient identified by MRN, date of birth, ID band Patient awake    Reviewed: Allergy & Precautions, H&P , NPO status , Patient's Chart, lab work & pertinent test results, reviewed documented beta blocker date and time   History of Anesthesia Complications (+) PONV and history of anesthetic complications  Airway Mallampati: II  TM Distance: >3 FB Neck ROM: full    Dental no notable dental hx. (+) Dental Advisory Given, Teeth Intact   Pulmonary asthma    Pulmonary exam normal breath sounds clear to auscultation       Cardiovascular Exercise Tolerance: Good hypertension, negative cardio ROS Normal cardiovascular exam Rhythm:regular Rate:Normal     Neuro/Psych  PSYCHIATRIC DISORDERS Anxiety     negative neurological ROS     GI/Hepatic negative GI ROS, Neg liver ROS,,,  Endo/Other  diabetes, Well Controlled, Type 2Hypothyroidism    Renal/GU negative Renal ROS  negative genitourinary   Musculoskeletal  (+) Arthritis , Osteoarthritis,    Abdominal   Peds  Hematology negative hematology ROS (+)   Anesthesia Other Findings   Reproductive/Obstetrics negative OB ROS                             Anesthesia Physical Anesthesia Plan  ASA: 3  Anesthesia Plan: General   Post-op Pain Management: Minimal or no pain anticipated   Induction: Intravenous  PONV Risk Score and Plan: Propofol infusion  Airway Management Planned: Nasal Cannula and Natural Airway  Additional Equipment: None  Intra-op Plan:   Post-operative Plan:   Informed Consent: I have reviewed the patients History and Physical, chart, labs and discussed the procedure including the risks, benefits and alternatives for the proposed anesthesia with the patient or authorized representative who has indicated his/her understanding and acceptance.     Dental Advisory Given  Plan Discussed with:  CRNA  Anesthesia Plan Comments:         Anesthesia Quick Evaluation

## 2023-11-30 NOTE — Transfer of Care (Signed)
Immediate Anesthesia Transfer of Care Note  Patient: [name]  Procedure(s) Performed: COLONOSCOPY WITH PROPOFOL POLYPECTOMY  Patient Location: Short Stay  Anesthesia Type:General  Level of Consciousness: awake, alert , and oriented  Airway & Oxygen Therapy: Patient Spontanous Breathing  Post-op Assessment: Report given to RN and Post -op Vital signs reviewed and stable  Post vital signs: Reviewed and stable  Last Vitals:  Vitals Value Taken Time  BP 99/56 11/30/23 0917  Temp 36.9 C 11/30/23 0917  Pulse 78 11/30/23 0917  Resp 17 11/30/23 0917  SpO2 96 % 11/30/23 0917    Last Pain:  Vitals:   11/30/23 0917  TempSrc: Oral  PainSc: 0-No pain      Patients Stated Pain Goal: 6 (11/30/23 6962)  Complications: No notable events documented.

## 2023-11-30 NOTE — Discharge Instructions (Addendum)
  Colonoscopy Discharge Instructions  Read the instructions outlined below and refer to this sheet in the next few weeks. These discharge instructions provide you with general information on caring for yourself after you leave the hospital. Your doctor may also give you specific instructions. While your treatment has been planned according to the most current medical practices available, unavoidable complications occasionally occur.   ACTIVITY You may resume your regular activity, but move at a slower pace for the next 24 hours.  Take frequent rest periods for the next 24 hours.  Walking will help get rid of the air and reduce the bloated feeling in your belly (abdomen).  No driving for 24 hours (because of the medicine (anesthesia) used during the test).   Do not sign any important legal documents or operate any machinery for 24 hours (because of the anesthesia used during the test).  NUTRITION Drink plenty of fluids.  You may resume your normal diet as instructed by your doctor.  Begin with a light meal and progress to your normal diet. Heavy or fried foods are harder to digest and may make you feel sick to your stomach (nauseated).  Avoid alcoholic beverages for 24 hours or as instructed.  MEDICATIONS You may resume your normal medications unless your doctor tells you otherwise.  WHAT YOU CAN EXPECT TODAY Some feelings of bloating in the abdomen.  Passage of more gas than usual.  Spotting of blood in your stool or on the toilet paper.  IF YOU HAD POLYPS REMOVED DURING THE COLONOSCOPY: No aspirin products for 7 days or as instructed.  No alcohol for 7 days or as instructed.  Eat a soft diet for the next 24 hours.  FINDING OUT THE RESULTS OF YOUR TEST Not all test results are available during your visit. If your test results are not back during the visit, make an appointment with your caregiver to find out the results. Do not assume everything is normal if you have not heard from your  caregiver or the medical facility. It is important for you to follow up on all of your test results.  SEEK IMMEDIATE MEDICAL ATTENTION IF: You have more than a spotting of blood in your stool.  Your belly is swollen (abdominal distention).  You are nauseated or vomiting.  You have a temperature over 101.  You have abdominal pain or discomfort that is severe or gets worse throughout the day.   Your colonoscopy revealed 6 polyp(s) which I removed successfully. Await pathology results, my office will contact you. I recommend repeating colonoscopy in 3-5 years for surveillance purposes depending on pathology results.    Follow up in GI office in 3 months  I hope you have a great rest of your week!  Hennie Duos. Marletta Lor, D.O. Gastroenterology and Hepatology [facility]

## 2023-11-30 NOTE — Anesthesia Postprocedure Evaluation (Signed)
Anesthesia Post Note  Patient: [name]  Procedure(s) Performed: COLONOSCOPY WITH PROPOFOL POLYPECTOMY  Patient location during evaluation: PACU Anesthesia Type: General Level of consciousness: awake and alert Pain management: pain level controlled Vital Signs Assessment: post-procedure vital signs reviewed and stable Respiratory status: spontaneous breathing, nonlabored ventilation, respiratory function stable and patient connected to nasal cannula oxygen Cardiovascular status: blood pressure returned to baseline and stable Postop Assessment: no apparent nausea or vomiting Anesthetic complications: no   There were no known notable events for this encounter.   Last Vitals:  Vitals:   11/30/23 0835 11/30/23 0917  BP: 130/85 (!) 99/56  Pulse: 78 78  Resp: 18 17  Temp: 37.2 C 36.9 C  SpO2: 99% 96%    Last Pain:  Vitals:   11/30/23 0917  TempSrc: Oral  PainSc: 0-No pain                 Simmone Cape L Cyla Haluska

## 2023-11-30 NOTE — H&P (Signed)
Primary Care Physician:  Assunta Found, MD Primary Gastroenterologist:  Dr. Marletta Lor  Pre-Procedure History & Physical: HPI:  [name] is a 57 y.o. female is here for a colonoscopy for colon cancer screening purposes.    Past Medical History:  Diagnosis Date   Anxiety    Arthritis    Asthma    seasonal   Diabetes mellitus without complication (HCC)    Hypercholesteremia    Hypertension    Hypothyroidism    PONV (postoperative nausea and vomiting)    Thyroid disease     Past Surgical History:  Procedure Laterality Date   APPENDECTOMY     age 39   CARPAL TUNNEL RELEASE Right 2015   GALLBLADDER SURGERY  2006   KNEE ARTHROSCOPY Right    x3   KNEE ARTHROSCOPY Right 09/09/2022   Procedure: RIGHT KNEE ARTHROSCOPY, chondroplasty;  Surgeon: Marcene Corning, MD;  Location: [facility];  Service: Orthopedics;  Laterality: Right;   NECK SURGERY  2018   c-4 disc   ROTATOR CUFF REPAIR Right    06/2021   SHOULDER ARTHROSCOPY WITH ROTATOR CUFF REPAIR Right 10/28/2022   Procedure: RIGHT SHOULDER ARTHROSCOPY  AC RESECTION and DEBRIDMENT;  Surgeon: Marcene Corning, MD;  Location: [facility];  Service: Orthopedics;  Laterality: Right;   SPINAL FUSION  2008   L5   THUMB ARTHROSCOPY     02/2021   WISDOM TOOTH EXTRACTION     age 5   WRIST ARTHROSCOPY WITH DEBRIDEMENT Right 12/16/2021   Procedure: RIGHT WRIST SCOPE AND DEBRIDEMENT;  Surgeon: Mack Hook, MD;  Location: [facility];  Service: Orthopedics;  Laterality: Right;  LENGTH OF SURGERY: 90 MINUTES PRE-OP REGIONAL BLOCK    Prior to Admission medications   Medication Sig Start Date End Date Taking? Authorizing Provider  acetaminophen (TYLENOL) 500 MG tablet Take 1,000 mg by mouth every 6 (six) hours as needed for moderate pain. Patient not taking: Reported on 10/22/2023    [provider]  albuterol (VENTOLIN HFA) 108 (90 Base) MCG/ACT inhaler Inhale 2 puffs into the lungs every 6 (six) hours as needed for  wheezing or shortness of breath.    [provider]  atenolol (TENORMIN) 25 MG tablet Take 25 mg by mouth daily.    [provider]  Budeson-Glycopyrrol-Formoterol (BREZTRI AEROSPHERE) 160-9-4.8 MCG/ACT AERO Inhale 2-3 puffs into the lungs daily as needed (shortness of breath).    [provider]  cholecalciferol (VITAMIN D3) 25 MCG (1000 UNIT) tablet Take 1,000 Units by mouth daily.    [provider]  cyanocobalamin (VITAMIN B12) 1000 MCG tablet Take 1,000 mcg by mouth daily.    [provider]  Dulaglutide (TRULICITY) 1.5 MG/0.5ML SOAJ Inject 3 mg into the skin once a week. Thursday    [provider]  escitalopram (LEXAPRO) 20 MG tablet Take 20 mg by mouth daily.    [provider]  levothyroxine (SYNTHROID, LEVOTHROID) 25 MCG tablet Take 25 mcg by mouth daily before breakfast.    [provider]  meclizine (ANTIVERT) 25 MG tablet Take 25 mg by mouth 3 (three) times daily as needed for dizziness.    [provider]  metFORMIN (GLUCOPHAGE) 1000 MG tablet Take 1,000 mg by mouth 2 (two) times daily with a meal.    [provider]  Misc Natural Products (DAILY HERBS IMMUNE DEFENSE PO) Take 1-2 capsules by mouth daily.    [provider]  montelukast (SINGULAIR) 10 MG tablet Take 10 mg by mouth at  bedtime.    [provider]  oxymetazoline (AFRIN) 0.05 % nasal spray Place 1 spray into both nostrils 2 (two) times daily as needed for congestion.    [provider]  pravastatin (PRAVACHOL) 20 MG tablet Take 20 mg by mouth daily. Patient not taking: Reported on 10/22/2023    [provider]  promethazine (PHENERGAN) 12.5 MG tablet Take 1 tablet (12.5 mg total) by mouth every 6 (six) hours as needed for nausea or vomiting. Patient not taking: Reported on 10/22/2023 09/09/22   Elodia Florence, PA-C  rosuvastatin (CRESTOR) 5 MG tablet Take 5 mg by mouth daily.    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    Family History  Problem Relation Age of Onset   Myasthenia gravis Father    Hypertension Mother     Social History   Socioeconomic History   Marital status: Widowed    Spouse name: Not on file   Number of children: Not on file   Years of education: Not on file   Highest education level: Not on file  Occupational History   Not on file  Tobacco Use   Smoking status: Never   Smokeless tobacco: Never  Vaping Use   Vaping status: Never Used  Substance and Sexual Activity   Alcohol use: Yes    Comment: 1-2 a month   Drug use: No   Sexual activity: Yes    Birth control/protection: Post-menopausal  Other Topics Concern   Not on file  Social History Narrative   Not on file   Social Determinants of Health   Financial Resource Strain: Not on file  Food Insecurity: Not on file  Transportation Needs: Not on file  Physical Activity: Not on file  Stress: Not on file  Social Connections: Not on file  Intimate Partner Violence: Not on file    Review of Systems: See HPI, otherwise negative ROS  Physical Exam: Vital signs in last 24 hours:     General:   Alert,  Well-developed, well-nourished, pleasant and cooperative in NAD Head:  Normocephalic and atraumatic. Eyes:  Sclera clear, no icterus.   Conjunctiva pink. Ears:  Normal auditory acuity. Nose:  No deformity, discharge,  or lesions. Msk:  Symmetrical without gross deformities. Normal posture. Extremities:  Without clubbing or edema. Neurologic:  Alert and  oriented x4;  grossly normal neurologically. Skin:  Intact without significant lesions or rashes. Psych:  Alert and cooperative. Normal mood and affect.  Impression/Plan: [name] is here for a colonoscopy to be performed for colon cancer screening purposes.  The risks of the procedure including infection, bleed, or perforation as  well as benefits, limitations, alternatives and imponderables have been reviewed with the patient. Questions have been answered. All parties agreeable.

## 2023-11-30 NOTE — Op Note (Signed)
[facility] Patient Name: [name] Procedure Date: 11/30/2023 8:25 AM MRN: [id number] Date of Birth: 1966-05-19 Attending MD: Hennie Duos. Marletta Lor , [place], [id number] CSN: [id number] Age: 57 Admit Type: Outpatient Procedure:                Colonoscopy Indications:              Screening for colorectal malignant neoplasm Providers:                Hennie Duos. Marletta Lor, DO, Angelica Ran, Kristine L.                            Jessee Avers, Technician Referring MD:              Medicines:                See the Anesthesia note for documentation of the                            administered medications Complications:            No immediate complications. Estimated Blood Loss:     Estimated blood loss was minimal. Procedure:                Pre-Anesthesia Assessment:                           - The anesthesia plan was to use monitored                            anesthesia care (MAC).                           After obtaining informed consent, the colonoscope                            was passed under direct vision. Throughout the                            procedure, the patient's blood pressure, pulse, and                            oxygen saturations were monitored continuously. The                            PCF-HQ190L ([id number]) scope was introduced through                            the anus and advanced to the the cecum, identified                            by appendiceal orifice and ileocecal valve. The                            colonoscopy was performed without difficulty. The                            patient tolerated the procedure  well. The quality                            of the bowel preparation was evaluated using the                            BBPS [facility] Bowel Preparation Scale) with scores                            of: Right Colon = 3, Transverse Colon = 3 and Left                            Colon = 3 (entire mucosa seen well with no residual                             staining, small fragments of stool or opaque                            liquid). The total BBPS score equals 9. Scope In: 8:55:39 AM Scope Out: 9:13:13 AM Scope Withdrawal Time: 0 hours 14 minutes 7 seconds  Total Procedure Duration: 0 hours 17 minutes 34 seconds  Findings:      A 2 mm polyp was found in the ascending colon. The polyp was sessile.       The polyp was removed with a cold biopsy forceps. Resection and       retrieval were complete.      Three sessile polyps were found in the transverse colon. The polyps were       5 to 7 mm in size. These polyps were removed with a cold snare.       Resection and retrieval were complete.      Two sessile polyps were found in the sigmoid colon. The polyps were 3 to       4 mm in size. These polyps were removed with a cold snare. Resection and       retrieval were complete.      The exam was otherwise without abnormality. Impression:               - One 2 mm polyp in the ascending colon, removed                            with a cold biopsy forceps. Resected and retrieved.                           - Three 5 to 7 mm polyps in the transverse colon,                            removed with a cold snare. Resected and retrieved.                           - Two 3 to 4 mm polyps in the sigmoid colon,                            removed with a cold snare. Resected and  retrieved.                           - The examination was otherwise normal. Moderate Sedation:      Per Anesthesia Care Recommendation:           - Patient has a contact number available for                            emergencies. The signs and symptoms of potential                            delayed complications were discussed with the                            patient. Return to normal activities tomorrow.                            Written discharge instructions were provided to the                            patient.                           - Resume previous diet.                            - Continue present medications.                           - Await pathology results.                           - Repeat colonoscopy in 3 - 5 years for                            surveillance.                           - Return to GI clinic in 3 months. Procedure Code(s):        --- Professional ---                           [phone], Colonoscopy, flexible; with removal of                            tumor(s), polyp(s), or other lesion(s) by snare                            technique                           45380, 59, Colonoscopy, flexible; with biopsy,                            single or multiple Diagnosis Code(s):        --- Professional ---  Z12.11, Encounter for screening for malignant                            neoplasm of colon                           D12.2, Benign neoplasm of ascending colon                           D12.3, Benign neoplasm of transverse colon (hepatic                            flexure or splenic flexure)                           D12.5, Benign neoplasm of sigmoid colon CPT copyright 2022 American Medical Association. All rights reserved. The codes documented in this report are preliminary and upon coder review may  be revised to meet current compliance requirements. Hennie Duos. Marletta Lor, DO Hennie Duos. Marletta Lor, DO 11/30/2023 9:16:36 AM This report has been signed electronically. Number of Addenda: 0

## 2023-12-02 DIAGNOSIS — M6281 Muscle weakness (generalized): Secondary | ICD-10-CM | POA: Diagnosis not present

## 2023-12-02 DIAGNOSIS — M25611 Stiffness of right shoulder, not elsewhere classified: Secondary | ICD-10-CM | POA: Diagnosis not present

## 2023-12-02 LAB — SURGICAL PATHOLOGY

## 2023-12-04 DIAGNOSIS — M6281 Muscle weakness (generalized): Secondary | ICD-10-CM | POA: Diagnosis not present

## 2023-12-04 DIAGNOSIS — M25611 Stiffness of right shoulder, not elsewhere classified: Secondary | ICD-10-CM | POA: Diagnosis not present

## 2023-12-07 ENCOUNTER — Encounter (HOSPITAL_COMMUNITY): Payer: Self-pay | Admitting: Internal Medicine

## 2023-12-07 DIAGNOSIS — M1711 Unilateral primary osteoarthritis, right knee: Secondary | ICD-10-CM | POA: Diagnosis not present

## 2023-12-07 DIAGNOSIS — M170 Bilateral primary osteoarthritis of knee: Secondary | ICD-10-CM | POA: Diagnosis not present

## 2023-12-07 DIAGNOSIS — M1712 Unilateral primary osteoarthritis, left knee: Secondary | ICD-10-CM | POA: Diagnosis not present

## 2023-12-07 DIAGNOSIS — M25551 Pain in right hip: Secondary | ICD-10-CM | POA: Diagnosis not present

## 2023-12-08 DIAGNOSIS — M6281 Muscle weakness (generalized): Secondary | ICD-10-CM | POA: Diagnosis not present

## 2023-12-08 DIAGNOSIS — M7061 Trochanteric bursitis, right hip: Secondary | ICD-10-CM | POA: Diagnosis not present

## 2023-12-28 DIAGNOSIS — M7061 Trochanteric bursitis, right hip: Secondary | ICD-10-CM | POA: Diagnosis not present

## 2023-12-28 DIAGNOSIS — M6281 Muscle weakness (generalized): Secondary | ICD-10-CM | POA: Diagnosis not present

## 2024-02-26 ENCOUNTER — Encounter: Payer: Self-pay | Admitting: Internal Medicine

## 2024-03-22 DIAGNOSIS — E1165 Type 2 diabetes mellitus with hyperglycemia: Secondary | ICD-10-CM | POA: Diagnosis not present

## 2024-03-22 DIAGNOSIS — E039 Hypothyroidism, unspecified: Secondary | ICD-10-CM | POA: Diagnosis not present

## 2024-04-29 DIAGNOSIS — E7800 Pure hypercholesterolemia, unspecified: Secondary | ICD-10-CM | POA: Diagnosis not present

## 2024-04-29 DIAGNOSIS — E039 Hypothyroidism, unspecified: Secondary | ICD-10-CM | POA: Diagnosis not present

## 2024-04-29 DIAGNOSIS — R748 Abnormal levels of other serum enzymes: Secondary | ICD-10-CM | POA: Diagnosis not present

## 2024-04-29 DIAGNOSIS — I10 Essential (primary) hypertension: Secondary | ICD-10-CM | POA: Diagnosis not present

## 2024-04-29 DIAGNOSIS — E1165 Type 2 diabetes mellitus with hyperglycemia: Secondary | ICD-10-CM | POA: Diagnosis not present

## 2024-07-14 ENCOUNTER — Other Ambulatory Visit (HOSPITAL_COMMUNITY): Payer: Self-pay | Admitting: Family Medicine

## 2024-07-14 DIAGNOSIS — E782 Mixed hyperlipidemia: Secondary | ICD-10-CM | POA: Diagnosis not present

## 2024-07-14 DIAGNOSIS — Z0001 Encounter for general adult medical examination with abnormal findings: Secondary | ICD-10-CM | POA: Diagnosis not present

## 2024-07-14 DIAGNOSIS — E1165 Type 2 diabetes mellitus with hyperglycemia: Secondary | ICD-10-CM | POA: Diagnosis not present

## 2024-07-14 DIAGNOSIS — R748 Abnormal levels of other serum enzymes: Secondary | ICD-10-CM | POA: Diagnosis not present

## 2024-07-14 DIAGNOSIS — E7849 Other hyperlipidemia: Secondary | ICD-10-CM | POA: Diagnosis not present

## 2024-07-14 DIAGNOSIS — M159 Polyosteoarthritis, unspecified: Secondary | ICD-10-CM | POA: Diagnosis not present

## 2024-07-14 DIAGNOSIS — M5030 Other cervical disc degeneration, unspecified cervical region: Secondary | ICD-10-CM | POA: Diagnosis not present

## 2024-07-14 DIAGNOSIS — I10 Essential (primary) hypertension: Secondary | ICD-10-CM | POA: Diagnosis not present

## 2024-07-14 DIAGNOSIS — E039 Hypothyroidism, unspecified: Secondary | ICD-10-CM | POA: Diagnosis not present

## 2024-07-22 ENCOUNTER — Ambulatory Visit (HOSPITAL_COMMUNITY)
Admission: RE | Admit: 2024-07-22 | Discharge: 2024-07-22 | Disposition: A | Discharge status: Home / Self Care | Source: Ambulatory Visit | Attending: Family Medicine | Admitting: Family Medicine

## 2024-07-22 DIAGNOSIS — M5030 Other cervical disc degeneration, unspecified cervical region: Secondary | ICD-10-CM | POA: Diagnosis not present

## 2024-07-22 DIAGNOSIS — M47812 Spondylosis without myelopathy or radiculopathy, cervical region: Secondary | ICD-10-CM | POA: Diagnosis not present

## 2024-07-22 DIAGNOSIS — M5020 Other cervical disc displacement, unspecified cervical region: Secondary | ICD-10-CM | POA: Diagnosis not present

## 2024-07-22 DIAGNOSIS — M4802 Spinal stenosis, cervical region: Secondary | ICD-10-CM | POA: Diagnosis not present

## 2024-08-02 DIAGNOSIS — G5602 Carpal tunnel syndrome, left upper limb: Secondary | ICD-10-CM | POA: Diagnosis not present

## 2024-08-02 DIAGNOSIS — G5622 Lesion of ulnar nerve, left upper limb: Secondary | ICD-10-CM | POA: Diagnosis not present

## 2024-08-02 DIAGNOSIS — G5621 Lesion of ulnar nerve, right upper limb: Secondary | ICD-10-CM | POA: Diagnosis not present

## 2024-08-02 DIAGNOSIS — M4722 Other spondylosis with radiculopathy, cervical region: Secondary | ICD-10-CM | POA: Diagnosis not present

## 2024-08-03 ENCOUNTER — Encounter: Payer: Self-pay | Admitting: Neurology

## 2024-08-03 ENCOUNTER — Other Ambulatory Visit: Payer: Self-pay

## 2024-08-03 DIAGNOSIS — R202 Paresthesia of skin: Secondary | ICD-10-CM

## 2024-08-10 DIAGNOSIS — M4722 Other spondylosis with radiculopathy, cervical region: Secondary | ICD-10-CM | POA: Diagnosis not present

## 2024-08-10 DIAGNOSIS — M542 Cervicalgia: Secondary | ICD-10-CM | POA: Diagnosis not present

## 2024-08-30 DIAGNOSIS — M4722 Other spondylosis with radiculopathy, cervical region: Secondary | ICD-10-CM | POA: Diagnosis not present

## 2024-08-30 DIAGNOSIS — M542 Cervicalgia: Secondary | ICD-10-CM | POA: Diagnosis not present

## 2024-08-31 DIAGNOSIS — E1165 Type 2 diabetes mellitus with hyperglycemia: Secondary | ICD-10-CM | POA: Diagnosis not present

## 2024-08-31 DIAGNOSIS — E039 Hypothyroidism, unspecified: Secondary | ICD-10-CM | POA: Diagnosis not present

## 2024-09-01 DIAGNOSIS — M542 Cervicalgia: Secondary | ICD-10-CM | POA: Diagnosis not present

## 2024-09-01 DIAGNOSIS — M4722 Other spondylosis with radiculopathy, cervical region: Secondary | ICD-10-CM | POA: Diagnosis not present

## 2024-09-06 DIAGNOSIS — M25531 Pain in right wrist: Secondary | ICD-10-CM | POA: Diagnosis not present

## 2024-09-19 ENCOUNTER — Encounter: Admitting: Neurology

## 2024-09-19 ENCOUNTER — Encounter: Payer: Self-pay | Admitting: Neurology

## 2024-09-26 ENCOUNTER — Ambulatory Visit (INDEPENDENT_AMBULATORY_CARE_PROVIDER_SITE_OTHER): Admitting: Neurology

## 2024-09-26 DIAGNOSIS — R202 Paresthesia of skin: Secondary | ICD-10-CM

## 2024-09-26 DIAGNOSIS — G5603 Carpal tunnel syndrome, bilateral upper limbs: Secondary | ICD-10-CM

## 2024-09-26 NOTE — Procedures (Signed)
 [facility]  [address] [place], [address]  [place], [place] [address] Tel: [phone] Fax: [phone] Test Date:  09/26/2024  Patient: [name] DOB: October 04, 1966 Physician: Venetia Potters, MD  Sex: Female Height: 5' 6 Ref Phys: Rockey Peru, MD  ID#: [id number]   Technician:    History: This is a 58 year old female with hand numbness.  NCV & EMG Findings: Extensive electrodiagnostic evaluation of bilateral upper limbs shows: Left median-ulnar palmar sensory response shows abnormal peak latency difference (0.42 ms). Bilateral median, bilateral ulnar, bilateral radial, and right median-ulnar palmar sensory responses are within normal limits. Right median (APB) motor response shows reduced amplitude (4.6 mV). Left median (APB) and bilateral ulnar (ADM) motor responses are within normal limits. Chronic motor axon loss changes without accompanying active denervation changes are seen in the right abductor pollicis brevis (APB) muscle. Cervical paraspinal muscles were deferred due to history of cervical spine surgery.  Impression: This is an abnormal study. The findings are most consistent with the following: Left median mononeuropathy at or distal to the wrist, consistent with carpal tunnel syndrome, very mild in degree electrically. Low amplitude right median motor response (APB) is likely the result of prior right carpal tunnel syndrome s/p release. No electrodiagnostic evidence of right or left cervical (C5-T1) motor radiculopathy. Screening studies for right or left ulnar or radial mononeuropathies are normal.    ___________________________ Venetia Potters, MD    Nerve Conduction Studies Motor Nerve Results    Latency Amplitude F-Lat Segment Distance CV Comment  Site (ms) Norm (mV) Norm (ms)  (cm) (m/s) Norm   Left Median (APB) Motor  Wrist 3.2  < 4.0 6.5  > 6.0        Elbow 7.8 - 6.4 -  Elbow-Wrist 24.5 53  > 50   Right Median (APB) Motor  Wrist 2.6  < 4.0 *4.6   > 6.0        Elbow 7.3 - 4.5 -  Elbow-Wrist 24 51  > 50   Left Ulnar (ADM) Motor  Wrist 1.55  < 3.1 9.4  > 7.0        Bel elbow 5.2 - 8.6 -  Bel elbow-Wrist 20 54  > 50   Ab elbow 7.1 - 8.2 -  Ab elbow-Bel elbow 10 53 -   Right Ulnar (ADM) Motor  Wrist 1.83  < 3.1 11.3  > 7.0        Bel elbow 4.9 - 10.7 -  Bel elbow-Wrist 19 61  > 50   Ab elbow 6.7 - 9.5 -  Ab elbow-Bel elbow 10 56 -    Sensory Sites    Neg Peak Lat Amplitude (O-P) Segment Distance Velocity Comment  Site (ms) Norm (V) Norm  (cm) (ms)   Left Median Sensory  Wrist-Dig II 3.3  < 3.6 27  > 15 Wrist-Dig II 13    Right Median Sensory  Wrist-Dig II 2.9  < 3.6 23  > 15 Wrist-Dig II 13    Left Median-Ulnar Palmar Sensory       Median  Palm-Wrist 2.2  < 2.2 91  > 10 Palm-Wrist 8         Ulnar  Palm-Wrist 1.78  < 2.2 24  > 5 Palm-Wrist 8    Right Median-Ulnar Palmar Sensory       Median  Palm-Wrist 1.98  < 2.2 44  > 10 Palm-Wrist 8         Ulnar  Palm-Wrist 1.63  <  2.2 21  > 5 Palm-Wrist 8    Left Radial Sensory  Forearm-Wrist 1.83  < 2.7 22  > 14 Forearm-Wrist 10    Right Radial Sensory  Forearm-Wrist 1.98  < 2.7 18  > 14 Forearm-Wrist 10    Left Ulnar Sensory  Wrist-Dig V 2.6  < 3.1 30  > 10 Wrist-Dig V 11    Right Ulnar Sensory  Wrist-Dig V 2.4  < 3.1 29  > 10 Wrist-Dig V 11     Inter-Nerve Comparisons   Nerve 1 Value 1 Nerve 2 Value 2 Parameter Result Normal  Sensory Sites  R Median Palm-Wrist 1.98 ms R Ulnar Palm-Wrist 1.63 ms Peak Lat Diff 0.35 ms <0.40  L Median Palm-Wrist 2.2 ms L Ulnar Palm-Wrist 1.78 ms Peak Lat Diff *0.42 ms <0.40   Electromyography   Side Muscle Ins.Act Fibs Fasc Recrt Amp Dur Poly Activation Comment  Right FDI Nml Nml Nml Nml Nml Nml Nml Nml N/A  Right EIP Nml Nml Nml Nml Nml Nml Nml Nml N/A  Right Pronator teres Nml Nml Nml Nml Nml Nml Nml Nml N/A  Right APB Nml Nml Nml *1- *1+ *1+ *1+ Nml N/A  Right Biceps Nml Nml Nml Nml Nml Nml Nml Nml N/A  Right Triceps Nml Nml Nml Nml Nml  Nml Nml Nml N/A  Right Deltoid Nml Nml Nml Nml Nml Nml Nml Nml N/A  Left FDI Nml Nml Nml Nml Nml Nml Nml Nml N/A  Left EIP Nml Nml Nml Nml Nml Nml Nml Nml N/A  Left Pronator teres Nml Nml Nml Nml Nml Nml Nml Nml N/A  Left Biceps Nml Nml Nml Nml Nml Nml Nml Nml N/A  Left Triceps Nml Nml Nml Nml Nml Nml Nml Nml N/A  Left Deltoid Nml Nml Nml Nml Nml Nml Nml Nml N/A      Waveforms:  Motor           Sensory

## 2024-09-27 DIAGNOSIS — G5602 Carpal tunnel syndrome, left upper limb: Secondary | ICD-10-CM | POA: Diagnosis not present

## 2025-01-09 NOTE — H&P (Signed)
 Surgical History & Physical  Patient Name: [name]  DOB: 1966-03-04  Surgery: Cataract extraction with intraocular lens implant phacoemulsification; Left Eye Surgeon: Lynwood Hermann MD Surgery Date: 01/13/2025 Pre-Op Date: 12/26/2024  HPI: A 55 Yr. old female patient 1. The patient complains of difficulty when driving due to glare from headlights or sun, which began 2 months ago. The left eye is affected. The episode is constant. The patient describes glare and hazy symptoms affecting their eyes/vision. The condition's severity is worsening. Symptoms occur when the patient is driving and reading. Here for cat eval. Ref by michial. Patient states she is having difficulty with glare during the day and night. Patient c/o difficulty reading fine print. patient states she feels blind in OS. Patient states the vision is OS declined rapidly over the past 2 months. This is negatively affecting the patient's quality of life and the patient is unable to function adequately in life with the current level of vision. A1c-7.0% HPI Completed by Dr. Lynwood Hermann  Medical History: Cataracts  Diabetes - DM Type 2 High Blood Pressure LDL Lung Problems  Review of Systems Endocrine DMII Respiratory Asthma All recorded systems are negative except as noted above.  Social Never smoked tobacco  Alcohol Never  Medication Ilevro, Prednisolone acetate, Moxifloxacin ,  Levothyroxine, Metformin, Nabumetone, Benzonatate, Montelukast, Atenolol, Bupropion HCl, Albuterol  sulfate, Escitalopram oxalate, Rosuvastatin, Trulicity, Prednisone  Sx/Procedures None  Drug Allergies  latex   History & Physical: Heent: cataracts NECK: supple without bruits LUNGS: lungs clear to auscultation CV: regular rate and rhythm Abdomen: soft and non-tender  Impression & Plan: Assessment: 1.  COMBINED FORMS AGE RELATED CATARACT; Both Eyes (H25.813) 2.  Diabetes Type 2 No retinopathy (E11.9) 3.  GLAUCOMA SUSPECT;  Both Eyes (H40.003) 4.  BLEPHARITIS; Right Upper Lid, Right Lower Lid, Left Upper Lid, Left Lower Lid (H01.001, H01.002,H01.004,H01.005) 5.  DERMATOCHALASIS, no surgery; Right Upper Lid, Left Upper Lid (H02.831, H02.834) 6.  Pinguecula; Both Eyes (H11.153)  Plan: 1.  Cataract accounts for the patient's decreased vision. This visual impairment is not correctable with a tolerable change in glasses or contact lenses. Cataract surgery with an implantation of a new lens should significantly improve the visual and functional status of the patient. Recommend phacoemulsification with intraocular lens. Discussed all risks, benefits, alternatives, and potential complications. Discussed the procedures and recovery. The patient desires to have surgery. A-scan/Biometry ordered and will be performed for intraocular lens calculations. The surgery will be performed in order to improve vision for driving, reading, and for eye examinations. Recommend Dextenza  for post-operative pain and inflammation. Educational materials provided: Cataract. History of corneal refractive Surgery: None History of Previous Ocular Surgery (PPV, other): None History of ocular trauma: None Use of Eye Pressure Lowering Drops: None No current contact lens use. Pupil Status: Dilates poorly - shugarcaine or Lidocaine +Omidira by protocol, Malyugin Ring Left Eye worse. OS first, then OD. Discussed Panoptix Pro Lens.  2.  Stressed importance of blood sugar and blood pressure control, and also yearly eye examinations. Discussed the need for ongoing proactive ocular exams and treatment, hopefully before visual symptoms develop.  3.  Based on cup to disc ratio. Findings, prognosis and treatment options reviewed. IOP low normal. Glaucoma is a slow progressive disease often causing painless vision loss. Compliance with treatment discussed, importance of regular follow ups and regular testing understood. OCT RNFL WNL OD. OS difficult for OCT to  analyze due to dense cataract.  4.  Blepharitis is present - recommend regular lid cleaning.  5.  Asymptomatic, recommend observation for now. Findings, prognosis and treatment options reviewed.  6.  Observe; Artificial tears as needed for irritation.

## 2025-01-10 ENCOUNTER — Encounter (HOSPITAL_COMMUNITY): Payer: Self-pay

## 2025-01-10 ENCOUNTER — Other Ambulatory Visit: Payer: Self-pay

## 2025-01-10 ENCOUNTER — Encounter (HOSPITAL_COMMUNITY)
Admission: RE | Admit: 2025-01-10 | Discharge: 2025-01-10 | Disposition: A | Discharge status: Home / Self Care | Source: Ambulatory Visit | Attending: Ophthalmology | Admitting: Ophthalmology

## 2025-01-13 ENCOUNTER — Ambulatory Visit (HOSPITAL_COMMUNITY)
Admission: RE | Admit: 2025-01-13 | Discharge: 2025-01-13 | Disposition: A | Discharge status: Home / Self Care | Attending: Ophthalmology | Admitting: Ophthalmology

## 2025-01-13 ENCOUNTER — Ambulatory Visit (HOSPITAL_COMMUNITY): Admitting: Anesthesiology

## 2025-01-13 ENCOUNTER — Encounter: Admission: RE | Disposition: A | Payer: Self-pay | Attending: Ophthalmology

## 2025-01-13 ENCOUNTER — Encounter (HOSPITAL_COMMUNITY): Payer: Self-pay | Admitting: Ophthalmology

## 2025-01-13 DIAGNOSIS — J45909 Unspecified asthma, uncomplicated: Secondary | ICD-10-CM | POA: Diagnosis not present

## 2025-01-13 DIAGNOSIS — I10 Essential (primary) hypertension: Secondary | ICD-10-CM | POA: Diagnosis not present

## 2025-01-13 DIAGNOSIS — E1136 Type 2 diabetes mellitus with diabetic cataract: Secondary | ICD-10-CM | POA: Insufficient documentation

## 2025-01-13 DIAGNOSIS — H40003 Preglaucoma, unspecified, bilateral: Secondary | ICD-10-CM | POA: Insufficient documentation

## 2025-01-13 DIAGNOSIS — H5712 Ocular pain, left eye: Secondary | ICD-10-CM | POA: Diagnosis present

## 2025-01-13 DIAGNOSIS — H25812 Combined forms of age-related cataract, left eye: Secondary | ICD-10-CM | POA: Insufficient documentation

## 2025-01-13 HISTORY — PX: CATARACT EXTRACTION W/PHACO: SHX586

## 2025-01-13 HISTORY — PX: INSERTION, STENT, DRUG-ELUTING, LACRIMAL CANALICULUS: SHX7453

## 2025-01-13 LAB — GLUCOSE, CAPILLARY: Glucose-Capillary: 131 mg/dL — ABNORMAL HIGH (ref 70–99)

## 2025-01-13 MED ORDER — BSS IO SOLN
INTRAOCULAR | Status: DC | PRN
  Administered 2025-01-13: 15 mL via INTRAOCULAR

## 2025-01-13 MED ORDER — LACTATED RINGERS IV SOLN: INTRAVENOUS | Status: DC

## 2025-01-13 MED ORDER — MIDAZOLAM HCL (PF) 2 MG/2ML IJ SOLN
INTRAMUSCULAR | Status: DC | PRN
  Administered 2025-01-13 (×2): 1 mg via INTRAVENOUS

## 2025-01-13 MED ORDER — PHENYLEPHRINE HCL 2.5 % OP SOLN
1.0000 [drp] | OPHTHALMIC | Status: AC | PRN
  Administered 2025-01-13 (×3): 1 [drp] via OPHTHALMIC

## 2025-01-13 MED ORDER — STERILE WATER FOR IRRIGATION IR SOLN
Status: DC | PRN
  Administered 2025-01-13: 1

## 2025-01-13 MED ORDER — MOXIFLOXACIN HCL 5 MG/ML IO SOLN
INTRAOCULAR | Status: DC | PRN
  Administered 2025-01-13: .2 mL via INTRACAMERAL

## 2025-01-13 MED ORDER — TETRACAINE HCL 0.5 % OP SOLN
1.0000 [drp] | OPHTHALMIC | Status: AC | PRN
  Administered 2025-01-13 (×3): 1 [drp] via OPHTHALMIC

## 2025-01-13 MED ORDER — SODIUM CHLORIDE 0.9% FLUSH
INTRAVENOUS | Status: DC | PRN
  Administered 2025-01-13 (×2): 3 mL via INTRAVENOUS

## 2025-01-13 MED ORDER — SODIUM HYALURONATE 23MG/ML IO SOSY
PREFILLED_SYRINGE | INTRAOCULAR | Status: DC | PRN
  Administered 2025-01-13: .6 mL via INTRAOCULAR

## 2025-01-13 MED ORDER — MIDAZOLAM HCL 2 MG/2ML IJ SOLN
INTRAMUSCULAR | Status: AC
  Filled 2025-01-13: qty 2

## 2025-01-13 MED ORDER — LIDOCAINE HCL 3.5 % OP GEL
1.0000 | Freq: Once | OPHTHALMIC | Status: AC
  Administered 2025-01-13: 1 via OPHTHALMIC

## 2025-01-13 MED ORDER — PHENYLEPHRINE-KETOROLAC 1-0.3 % IO SOLN
INTRAOCULAR | Status: DC | PRN
  Administered 2025-01-13: 500 mL via OPHTHALMIC

## 2025-01-13 MED ORDER — SODIUM HYALURONATE 10 MG/ML IO SOLUTION
PREFILLED_SYRINGE | INTRAOCULAR | Status: DC | PRN
  Administered 2025-01-13: .85 mL via INTRAOCULAR

## 2025-01-13 MED ORDER — TROPICAMIDE 1 % OP SOLN
1.0000 [drp] | OPHTHALMIC | Status: AC | PRN
  Administered 2025-01-13 (×3): 1 [drp] via OPHTHALMIC

## 2025-01-13 MED ORDER — POVIDONE-IODINE 5 % OP SOLN
OPHTHALMIC | Status: DC | PRN
  Administered 2025-01-13: 1 via OPHTHALMIC

## 2025-01-13 MED ORDER — LIDOCAINE HCL (PF) 1 % IJ SOLN
INTRAMUSCULAR | Status: DC | PRN
  Administered 2025-01-13: 1 mL

## 2025-01-13 MED ORDER — DEXAMETHASONE 0.4 MG OP INST
VAGINAL_INSERT | OPHTHALMIC | Status: DC | PRN
  Administered 2025-01-13: .4 mg via OPHTHALMIC

## 2025-01-13 MED ORDER — DEXAMETHASONE 0.4 MG OP INST
VAGINAL_INSERT | OPHTHALMIC | Status: AC
  Filled 2025-01-13: qty 1

## 2025-01-13 NOTE — Interval H&P Note (Signed)
 History and Physical Interval Note:  01/13/2025 11:30 AM  [name]  has presented today for surgery, with the diagnosis of combined forms age related cataract, left eye.  The various methods of treatment have been discussed with the patient and family. After consideration of risks, benefits and other options for treatment, the patient has consented to  Procedures with comments: PHACOEMULSIFICATION, CATARACT, WITH IOL INSERTION (Left) - CDE: INSERTION, STENT, DRUG-ELUTING, LACRIMAL CANALICULUS (Left) as a surgical intervention.  The patient's history has been reviewed, patient examined, no change in status, stable for surgery.  I have reviewed the patient's chart and labs.  Questions were answered to the patient's satisfaction.     HARRIE AGENT

## 2025-01-13 NOTE — Transfer of Care (Signed)
 Immediate Anesthesia Transfer of Care Note  Patient: [name]  Procedure(s) Performed: PHACOEMULSIFICATION, CATARACT, WITH IOL INSERTION (Left: Eye) INSERTION, STENT, DRUG-ELUTING, LACRIMAL CANALICULUS (Left: Eye)  Patient Location: Short Stay  Anesthesia Type:MAC  Level of Consciousness: awake and patient cooperative  Airway & Oxygen Therapy: Patient Spontanous Breathing  Post-op Assessment: Report given to RN and Post -op Vital signs reviewed and stable  Post vital signs: Reviewed and stable  Last Vitals:  Vitals Value Taken Time  BP 116/83 01/13/25 12:01  Temp 37.1 C 01/13/25 12:01  Pulse 67 01/13/25 12:01  Resp 13 01/13/25 12:01  SpO2 97 % 01/13/25 12:01    Last Pain:  Vitals:   01/13/25 1201  TempSrc: Oral  PainSc: 0-No pain      Patients Stated Pain Goal: 6 (01/13/25 1114)  Complications: No notable events documented.

## 2025-01-13 NOTE — Anesthesia Postprocedure Evaluation (Signed)
"   Anesthesia Post Note  Patient: [name]  Procedure(s) Performed: PHACOEMULSIFICATION, CATARACT, WITH IOL INSERTION (Left: Eye) INSERTION, STENT, DRUG-ELUTING, LACRIMAL CANALICULUS (Left: Eye)  Patient location during evaluation: Phase II Anesthesia Type: MAC Level of consciousness: awake Pain management: pain level controlled Vital Signs Assessment: post-procedure vital signs reviewed and stable Respiratory status: spontaneous breathing and respiratory function stable Cardiovascular status: blood pressure returned to baseline and stable Postop Assessment: no headache and no apparent nausea or vomiting Anesthetic complications: no Comments: Late entry   No notable events documented.   Last Vitals:  Vitals:   01/13/25 1123 01/13/25 1201  BP: 102/79 116/83  Pulse:  67  Resp: 16 13  Temp: 37.3 C 37.1 C  SpO2: 99% 97%    Last Pain:  Vitals:   01/13/25 1201  TempSrc: Oral  PainSc: 0-No pain                 Yvonna PARAS [name]      "

## 2025-01-13 NOTE — Discharge Instructions (Addendum)
 Please discharge patient when stable, will follow up today with Dr. June Leap at the [facility] office immediately following discharge.  Leave shield in place until visit.  All paperwork with discharge instructions will be given at the office.  [facility] Address:  [address]  [place], [place] [address]

## 2025-01-13 NOTE — Op Note (Signed)
 Date of procedure: 01/13/25  Pre-operative diagnosis: Visually significant age-related combined cataract, Left Eye (H25.812)  Post-operative diagnosis:  Visually significant age-related combined cataract, Left Eye (H25.812) 2.   Pain and inflammation following cataract surgery, Left Eye (H57.12)  Procedure:  Removal of cataract via phacoemulsification and insertion of intra-ocular lens [employer] DIB00 +23.0D into the capsular bag of the Left Eye 2. Placement of Dextenza  Implant, Left Lower Lid  Attending surgeon: Lynwood LABOR. Ples Trudel, MD, MA  Anesthesia: MAC, Topical Akten  Complications: None  Estimated Blood Loss: <6mL (minimal)  Specimens: None  Implants: As above  Indications:  Visually significant age-related cataract, Left Eye  Procedure:  The patient was seen and identified in the pre-operative area. The operative eye was identified and dilated.  The operative eye was marked.  Topical anesthesia was administered to the operative eye.     The patient was then to the operative suite and placed in the supine position.  A timeout was performed confirming the patient, procedure to be performed, and all other relevant information.   The patient's face was prepped and draped in the usual fashion for intra-ocular surgery.  A lid speculum was placed into the operative eye and the surgical microscope moved into place and focused.  An inferotemporal paracentesis was created using a 20 gauge paracentesis blade. Omidria  was injected into the anterior chamber. Shugarcaine was injected into the anterior chamber.  Viscoelastic was injected into the anterior chamber.  A temporal clear-corneal main wound incision was created using a 2.86mm microkeratome.  A continuous curvilinear capsulorrhexis was initiated using an irrigating cystitome and completed using capsulorrhexis forceps.  Hydrodissection and hydrodeliniation were performed.  Viscoelastic was injected into the anterior chamber.  A  phacoemulsification handpiece and a chopper as a second instrument were used to remove the nucleus and epinucleus. The irrigation/aspiration handpiece was used to remove any remaining cortical material.   The capsular bag was reinflated with viscoelastic, checked, and found to be intact.  The intraocular lens was inserted into the capsular bag.  The irrigation/aspiration handpiece was used to remove any remaining viscoelastic.  The clear corneal wound and paracentesis wounds were then hydrated and checked with Weck-Cels to be watertight.  0.1mL of moxifloxacin  was injected into the anterior chamber.  The lid-speculum was removed. The lower punctum was dilated. A Dextenza  implant was placed in the lower canaliculus without complication.   The drape was removed.  The patient's face was cleaned with a wet and dry 4x4.   A clear shield was taped over the eye. The patient was taken to the post-operative care unit in good condition, having tolerated the procedure well.  Post-Op Instructions: The patient will follow up at [facility] for a same day post-operative evaluation and will receive all other orders and instructions.

## 2025-01-13 NOTE — Anesthesia Procedure Notes (Signed)
 Date/Time: 01/13/2025 11:36 AM  Performed by: Barbarann Verneita RAMAN, CRNAPre-anesthesia Checklist: Patient identified, Emergency Drugs available, Suction available, Timeout performed and Patient being monitored Patient Re-evaluated:Patient Re-evaluated prior to induction Oxygen Delivery Method: Nasal Cannula

## 2025-01-13 NOTE — Anesthesia Preprocedure Evaluation (Signed)
"                                    Anesthesia Evaluation  Patient identified by MRN, date of birth, ID band Patient awake    Reviewed: Allergy & Precautions, H&P , NPO status , Patient's Chart, lab work & pertinent test results, reviewed documented beta blocker date and time   History of Anesthesia Complications (+) PONV and history of anesthetic complications  Airway Mallampati: II  TM Distance: >3 FB Neck ROM: full    Dental no notable dental hx.    Pulmonary asthma    Pulmonary exam normal breath sounds clear to auscultation       Cardiovascular Exercise Tolerance: Good hypertension,  Rhythm:regular Rate:Normal     Neuro/Psych   Anxiety     negative neurological ROS  negative psych ROS   GI/Hepatic negative GI ROS, Neg liver ROS,,,  Endo/Other  diabetesHypothyroidism    Renal/GU negative Renal ROS  negative genitourinary   Musculoskeletal   Abdominal   Peds  Hematology negative hematology ROS (+)   Anesthesia Other Findings   Reproductive/Obstetrics negative OB ROS                              Anesthesia Physical Anesthesia Plan  ASA: 2  Anesthesia Plan: MAC   Post-op Pain Management:    Induction:   PONV Risk Score and Plan:   Airway Management Planned:   Additional Equipment:   Intra-op Plan:   Post-operative Plan:   Informed Consent: I have reviewed the patients History and Physical, chart, labs and discussed the procedure including the risks, benefits and alternatives for the proposed anesthesia with the patient or authorized representative who has indicated his/her understanding and acceptance.     Dental Advisory Given  Plan Discussed with: CRNA  Anesthesia Plan Comments:         Anesthesia Quick Evaluation  "

## 2025-01-16 ENCOUNTER — Encounter (HOSPITAL_COMMUNITY): Payer: Self-pay | Admitting: Ophthalmology

## 2025-01-19 ENCOUNTER — Encounter (HOSPITAL_COMMUNITY): Payer: Self-pay

## 2025-01-19 NOTE — Pre-Procedure Instructions (Signed)
 Attempted pre-op phonecall. Left VM for her to call us  back.

## 2025-01-20 ENCOUNTER — Encounter (HOSPITAL_COMMUNITY)
Admission: RE | Admit: 2025-01-20 | Discharge: 2025-01-20 | Disposition: A | Source: Ambulatory Visit | Attending: Ophthalmology

## 2025-01-24 NOTE — H&P (Signed)
 Surgical History & Physical  Patient Name: [name]  DOB: December 12, 1966  Surgery: Cataract extraction with intraocular lens implant phacoemulsification; Right Eye Surgeon: Lynwood Hermann MD Surgery Date: 01/27/2025 Pre-Op Date: 01/19/2025  HPI: A 83 Yr. old female patient 1.  The patient is returning for a 6 day cataract follow-up of the left eye. Since the last visit, the affected area is doing well. The patient's vision is improved. The condition's severity is constant. Patient is following medication instructions. 2.  Patient also here for blurry vision in the right eye. The blurry vision is constant and worsening, having been present for several months. The patient is having trouble with glare when driving at night as well as reading small print. Pt. is ready to proceed with cataract surgery on OD this is negatively affecting the patient's quality of life and the patient is unable to function adequately in life with the current level of vision. HPI Completed by Dr. Lynwood Hermann  Medical History: Cataracts  Diabetes - DM Type 2 High Blood Pressure LDL Lung Problems  Review of Systems Endocrine DMII Respiratory Asthma All recorded systems are negative except as noted above.  Social Never smoked tobacco   Alcohol Never  Medication Moxifloxacin , Pred Acetate, Ilevro, Prednisolone acetate, Ilevro, Moxifloxacin ,  Levothyroxine, Metformin, Nabumetone, Benzonatate, Montelukast, Atenolol, Bupropion HCl, Albuterol  sulfate, Escitalopram oxalate, Rosuvastatin, Trulicity, Prednisone  Sx/Procedures Phaco c IOL OS  Drug Allergies  latex   History & Physical: Heent: cataract NECK: supple without bruits LUNGS: lungs clear to auscultation CV: regular rate and rhythm Abdomen: soft and non-tender  Impression & Plan: Assessment: 1.  CATARACT EXTRACTION STATUS; Left Eye (Z98.42) 2.  COMBINED FORMS AGE RELATED CATARACT; Right Eye (H25.811) 3.  NUCLEAR SCLEROSIS AGE RELATED; Right  Eye (H25.11)  Plan: 1.  1 week after cataract surgery. Doing well with improved vision and normal eye pressure. Call with any problems or concerns. Stop Vigamox . Continue Ilevro 1 drop 1x/day for 1 more week. Continue Pred Acetate 1 drop 2x/day for 2 more week.  2.  Cataract accounts for the patient's decreased vision. This visual impairment is not correctable with a tolerable change in glasses or contact lenses. Cataract surgery with an implantation of a new lens should significantly improve the visual and functional status of the patient. Recommend phacoemulsification with intraocular lens. Discussed all risks, benefits, alternatives, and potential complications. Discussed the procedures and recovery. The patient desires to have surgery. A-scan/Biometry ordered and will be performed for intraocular lens calculations. The surgery will be performed in order to improve vision for driving, reading, and for eye examinations. Recommend Dextenza  for post-operative pain and inflammation. Educational materials provided: Cataract. History of corneal refractive Surgery: None History of Previous Ocular Surgery (PPV, other): None History of ocular trauma: None Use of Eye Pressure Lowering Drops: None No current contact lens use. Pupil Status: Dilates poorly - shugarcaine or Lidocaine +Omidira by protocol, Malyugin Ring Right Eye.  3.  See above

## 2025-01-27 ENCOUNTER — Ambulatory Visit (HOSPITAL_COMMUNITY)
Admission: RE | Admit: 2025-01-27 | Discharge: 2025-01-27 | Disposition: A | Discharge status: Home / Self Care | Attending: Ophthalmology | Admitting: Ophthalmology

## 2025-01-27 ENCOUNTER — Encounter (HOSPITAL_COMMUNITY)
Admission: RE | Disposition: A | Discharge status: Home / Self Care | Payer: Self-pay | Source: Home / Self Care | Attending: Ophthalmology

## 2025-01-27 ENCOUNTER — Ambulatory Visit (HOSPITAL_COMMUNITY): Payer: Self-pay | Admitting: Anesthesiology

## 2025-01-27 ENCOUNTER — Encounter (HOSPITAL_COMMUNITY): Payer: Self-pay | Admitting: Ophthalmology

## 2025-01-27 DIAGNOSIS — E039 Hypothyroidism, unspecified: Secondary | ICD-10-CM | POA: Diagnosis not present

## 2025-01-27 DIAGNOSIS — Z79899 Other long term (current) drug therapy: Secondary | ICD-10-CM | POA: Diagnosis not present

## 2025-01-27 DIAGNOSIS — E1136 Type 2 diabetes mellitus with diabetic cataract: Secondary | ICD-10-CM | POA: Diagnosis not present

## 2025-01-27 DIAGNOSIS — F419 Anxiety disorder, unspecified: Secondary | ICD-10-CM | POA: Diagnosis not present

## 2025-01-27 DIAGNOSIS — Z961 Presence of intraocular lens: Secondary | ICD-10-CM | POA: Diagnosis not present

## 2025-01-27 DIAGNOSIS — Z9842 Cataract extraction status, left eye: Secondary | ICD-10-CM | POA: Insufficient documentation

## 2025-01-27 DIAGNOSIS — J45909 Unspecified asthma, uncomplicated: Secondary | ICD-10-CM | POA: Diagnosis not present

## 2025-01-27 DIAGNOSIS — H5711 Ocular pain, right eye: Secondary | ICD-10-CM | POA: Insufficient documentation

## 2025-01-27 DIAGNOSIS — H25811 Combined forms of age-related cataract, right eye: Secondary | ICD-10-CM | POA: Insufficient documentation

## 2025-01-27 DIAGNOSIS — I10 Essential (primary) hypertension: Secondary | ICD-10-CM | POA: Insufficient documentation

## 2025-01-27 LAB — GLUCOSE, CAPILLARY: Glucose-Capillary: 127 mg/dL — ABNORMAL HIGH (ref 70–99)

## 2025-01-27 MED ORDER — TROPICAMIDE 1 % OP SOLN
1.0000 [drp] | OPHTHALMIC | Status: AC | PRN
  Administered 2025-01-27 (×3): 1 [drp] via OPHTHALMIC

## 2025-01-27 MED ORDER — SODIUM CHLORIDE 0.9% FLUSH
INTRAVENOUS | Status: DC | PRN
  Administered 2025-01-27: 3 mL via INTRAVENOUS

## 2025-01-27 MED ORDER — TETRACAINE HCL 0.5 % OP SOLN
1.0000 [drp] | OPHTHALMIC | Status: AC | PRN
  Administered 2025-01-27 (×3): 1 [drp] via OPHTHALMIC

## 2025-01-27 MED ORDER — LIDOCAINE HCL 3.5 % OP GEL
1.0000 | Freq: Once | OPHTHALMIC | Status: AC
  Administered 2025-01-27: 1 via OPHTHALMIC

## 2025-01-27 MED ORDER — PHENYLEPHRINE HCL 2.5 % OP SOLN
1.0000 [drp] | OPHTHALMIC | Status: AC | PRN
  Administered 2025-01-27 (×3): 1 [drp] via OPHTHALMIC

## 2025-01-27 MED ORDER — BSS IO SOLN
INTRAOCULAR | Status: DC | PRN
  Administered 2025-01-27: 15 mL via INTRAOCULAR

## 2025-01-27 MED ORDER — PHENYLEPHRINE-KETOROLAC 1-0.3 % IO SOLN
INTRAOCULAR | Status: DC | PRN
  Administered 2025-01-27: 500 mL via OPHTHALMIC

## 2025-01-27 MED ORDER — SODIUM HYALURONATE 10 MG/ML IO SOLUTION
PREFILLED_SYRINGE | INTRAOCULAR | Status: DC | PRN
  Administered 2025-01-27: .85 mL via INTRAOCULAR

## 2025-01-27 MED ORDER — SODIUM HYALURONATE 23MG/ML IO SOSY
PREFILLED_SYRINGE | INTRAOCULAR | Status: DC | PRN
  Administered 2025-01-27: .6 mL via INTRAOCULAR

## 2025-01-27 MED ORDER — MIDAZOLAM HCL (PF) 2 MG/2ML IJ SOLN
INTRAMUSCULAR | Status: DC | PRN
  Administered 2025-01-27: 1 mg via INTRAVENOUS

## 2025-01-27 MED ORDER — POVIDONE-IODINE 5 % OP SOLN
OPHTHALMIC | Status: DC | PRN
  Administered 2025-01-27: 1 via OPHTHALMIC

## 2025-01-27 MED ORDER — STERILE WATER FOR IRRIGATION IR SOLN
Status: DC | PRN
  Administered 2025-01-27: 1

## 2025-01-27 MED ORDER — DEXAMETHASONE 0.4 MG OP INST
VAGINAL_INSERT | OPHTHALMIC | Status: AC
  Filled 2025-01-27: qty 1

## 2025-01-27 MED ORDER — DEXAMETHASONE 0.4 MG OP INST
VAGINAL_INSERT | OPHTHALMIC | Status: DC | PRN
  Administered 2025-01-27: .4 mg via OPHTHALMIC

## 2025-01-27 MED ORDER — MIDAZOLAM HCL 2 MG/2ML IJ SOLN
INTRAMUSCULAR | Status: AC
  Filled 2025-01-27: qty 2

## 2025-01-27 MED ORDER — MOXIFLOXACIN HCL 5 MG/ML IO SOLN
INTRAOCULAR | Status: DC | PRN
  Administered 2025-01-27: .3 mL via INTRACAMERAL

## 2025-01-27 MED ORDER — LIDOCAINE HCL (PF) 1 % IJ SOLN
INTRAMUSCULAR | Status: DC | PRN
  Administered 2025-01-27: 1 mL

## 2025-01-27 NOTE — Discharge Instructions (Addendum)
 Please discharge patient when stable, will follow up today with Dr. June Leap at the [facility] office immediately following discharge.  Leave shield in place until visit.  All paperwork with discharge instructions will be given at the office.  [facility] Address:  [address]  [place], [place] [address]

## 2025-01-27 NOTE — Anesthesia Postprocedure Evaluation (Signed)
"   Anesthesia Post Note  Patient: [name]  Procedure(s) Performed: PHACOEMULSIFICATION, CATARACT, WITH IOL INSERTION (Right: Eye) INSERTION, STENT, DRUG-ELUTING, LACRIMAL CANALICULUS (Right: Eye)  Patient location during evaluation: Phase II Anesthesia Type: MAC Level of consciousness: awake Pain management: pain level controlled Vital Signs Assessment: post-procedure vital signs reviewed and stable Respiratory status: spontaneous breathing and respiratory function stable Cardiovascular status: blood pressure returned to baseline and stable Postop Assessment: no headache and no apparent nausea or vomiting Anesthetic complications: no Comments: Late entry   No notable events documented.   Last Vitals:  Vitals:   01/27/25 0711 01/27/25 0818  BP: 113/80 108/74  Pulse:  66  Resp: 16 11  Temp: 36.9 C 36.8 C  SpO2: 99%     Last Pain:  Vitals:   01/27/25 0818  TempSrc: Oral  PainSc: 0-No pain                 [name]      "

## 2025-01-27 NOTE — Interval H&P Note (Signed)
 History and Physical Interval Note:  01/27/2025 7:49 AM  [name]  has presented today for surgery, with the diagnosis of combined forms age related cataract, right eye.  The various methods of treatment have been discussed with the patient and family. After consideration of risks, benefits and other options for treatment, the patient has consented to  Procedures: PHACOEMULSIFICATION, CATARACT, WITH IOL INSERTION (Right) INSERTION, STENT, DRUG-ELUTING, LACRIMAL CANALICULUS (Right) as a surgical intervention.  The patient's history has been reviewed, patient examined, no change in status, stable for surgery.  I have reviewed the patient's chart and labs.  Questions were answered to the patient's satisfaction.     HARRIE AGENT

## 2025-01-27 NOTE — Anesthesia Preprocedure Evaluation (Signed)
"                                    Anesthesia Evaluation  Patient identified by MRN, date of birth, ID band Patient awake    Reviewed: Allergy & Precautions, H&P , NPO status , Patient's Chart, lab work & pertinent test results, reviewed documented beta blocker date and time   History of Anesthesia Complications (+) PONV and history of anesthetic complications  Airway Mallampati: II  TM Distance: >3 FB Neck ROM: full    Dental no notable dental hx.    Pulmonary asthma    Pulmonary exam normal breath sounds clear to auscultation       Cardiovascular Exercise Tolerance: Good hypertension,  Rhythm:regular Rate:Normal     Neuro/Psych   Anxiety     negative neurological ROS  negative psych ROS   GI/Hepatic negative GI ROS, Neg liver ROS,,,  Endo/Other  diabetesHypothyroidism    Renal/GU negative Renal ROS  negative genitourinary   Musculoskeletal   Abdominal   Peds  Hematology negative hematology ROS (+)   Anesthesia Other Findings   Reproductive/Obstetrics negative OB ROS                              Anesthesia Physical Anesthesia Plan  ASA: 2  Anesthesia Plan: MAC   Post-op Pain Management:    Induction:   PONV Risk Score and Plan:   Airway Management Planned:   Additional Equipment:   Intra-op Plan:   Post-operative Plan:   Informed Consent: I have reviewed the patients History and Physical, chart, labs and discussed the procedure including the risks, benefits and alternatives for the proposed anesthesia with the patient or authorized representative who has indicated his/her understanding and acceptance.     Dental Advisory Given  Plan Discussed with: CRNA  Anesthesia Plan Comments:         Anesthesia Quick Evaluation  "

## 2025-01-27 NOTE — Transfer of Care (Signed)
 Immediate Anesthesia Transfer of Care Note  Patient: [name]  Procedure(s) Performed: PHACOEMULSIFICATION, CATARACT, WITH IOL INSERTION (Right: Eye) INSERTION, STENT, DRUG-ELUTING, LACRIMAL CANALICULUS (Right: Eye)  Patient Location: Short Stay  Anesthesia Type:MAC  Level of Consciousness: awake, alert , oriented, and patient cooperative  Airway & Oxygen Therapy: Patient Spontanous Breathing  Post-op Assessment: Report given to RN and Post -op Vital signs reviewed and stable  Post vital signs: Reviewed and stable  Last Vitals:  Vitals Value Taken Time  BP 108/74 01/27/25 08:18  Temp    Pulse 72 01/27/25 08:18  Resp 15 01/27/25 08:18  SpO2 98% on RA 01/27/25 08:18    Last Pain:  Vitals:   01/27/25 0711  PainSc: 5       Patients Stated Pain Goal: 4 (01/27/25 0711)  Complications: No notable events documented.

## 2025-01-27 NOTE — Op Note (Signed)
 Date of procedure: 01/27/25  Pre-operative diagnosis:  Visually significant combined form age-related cataract, Right Eye (H25.811)  Post-operative diagnosis:   1. Visually significant combined form age-related cataract, Right Eye (H25.811) 2. Pain and inflammation following cataract surgery Right Eye (H57.11)  Procedure:  Removal of cataract via phacoemulsification and insertion of intra-ocular lens [employer] DIB00 +23.5D into the capsular bag of the Right Eye 2. Placement of Dextenza  insert, Right Eye  Attending surgeon: Lynwood LABOR. Jocee Kissick, MD, MA  Anesthesia: MAC, Topical Akten   Complications: None  Estimated Blood Loss: <49mL (minimal)  Specimens: None  Implants: As above  Indications:  Visually significant age-related cataract, Right Eye  Procedure:  The patient was seen and identified in the pre-operative area. The operative eye was identified and dilated.  The operative eye was marked.  Topical anesthesia was administered to the operative eye.     The patient was then to the operative suite and placed in the supine position.  A timeout was performed confirming the patient, procedure to be performed, and all other relevant information.   The patient's face was prepped and draped in the usual fashion for intra-ocular surgery.  A lid speculum was placed into the operative eye and the surgical microscope moved into place and focused.  A superotemporal paracentesis was created using a 20 gauge paracentesis blade. Omidria  was injected into the anterior chamber. Shugarcaine was injected into the anterior chamber.  Viscoelastic was injected into the anterior chamber.  A temporal clear-corneal main wound incision was created using a 2.84mm microkeratome.  A continuous curvilinear capsulorrhexis was initiated using an irrigating cystitome and completed using capsulorrhexis forceps.  Hydrodissection and hydrodeliniation were performed.  Viscoelastic was injected into the anterior  chamber.  A phacoemulsification handpiece and a chopper as a second instrument were used to remove the nucleus and epinucleus. The irrigation/aspiration handpiece was used to remove any remaining cortical material.   The capsular bag was reinflated with viscoelastic, checked, and found to be intact.  The intraocular lens was inserted into the capsular bag.  The irrigation/aspiration handpiece was used to remove any remaining viscoelastic.  The clear corneal wound and paracentesis wounds were then hydrated and checked with Weck-Cels to be watertight. 0.1mL of moxifloxacin  was injected into the anterior chamber.  The lid-speculum was removed. The lower punctum was dilated. A Dextenza  implant was placed in the lower canaliculus without complication.  The drape was removed.  The patient's face was cleaned with a wet and dry 4x4. A clear shield was taped over the eye. The patient was taken to the post-operative care unit in good condition, having tolerated the procedure well.  Post-Op Instructions: The patient will follow up at [facility] for a same day post-operative evaluation and will receive all other orders and instructions.

## 2025-01-29 ENCOUNTER — Encounter (HOSPITAL_COMMUNITY): Payer: Self-pay | Admitting: Ophthalmology
